# Patient Record
Sex: Female | Born: 2000 | Race: Black or African American | Hispanic: No | Marital: Single | Smoking: Never smoker
Health system: Southern US, Community
[De-identification: ages and names within clinical notes are randomized; demographics above are authoritative.]

## PROBLEM LIST (undated history)

## (undated) DIAGNOSIS — Z789 Other specified health status: Secondary | ICD-10-CM

## (undated) HISTORY — PX: NO PAST SURGERIES: SHX2092

## (undated) HISTORY — PX: WISDOM TOOTH EXTRACTION: SHX21

## (undated) HISTORY — DX: Other specified health status: Z78.9

---
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
## 2019-03-31 ENCOUNTER — Emergency Department
Admission: EM | Admit: 2019-03-31 | Discharge: 2019-03-31 | Disposition: A | Discharge status: Home / Self Care | Payer: BC Managed Care – PPO | Attending: Emergency Medicine | Admitting: Emergency Medicine

## 2019-03-31 ENCOUNTER — Other Ambulatory Visit: Payer: Self-pay

## 2019-03-31 ENCOUNTER — Encounter: Payer: Self-pay | Admitting: Emergency Medicine

## 2019-03-31 DIAGNOSIS — M7918 Myalgia, other site: Secondary | ICD-10-CM | POA: Diagnosis not present

## 2019-03-31 DIAGNOSIS — Z5321 Procedure and treatment not carried out due to patient leaving prior to being seen by health care provider: Secondary | ICD-10-CM | POA: Insufficient documentation

## 2019-03-31 DIAGNOSIS — R519 Headache, unspecified: Secondary | ICD-10-CM | POA: Diagnosis not present

## 2019-03-31 MED ORDER — ACETAMINOPHEN 500 MG PO TABS
1000.0000 mg | ORAL_TABLET | Freq: Once | ORAL | Status: AC
  Administered 2019-03-31: 1000 mg via ORAL
  Filled 2019-03-31: qty 2

## 2019-03-31 NOTE — ED Notes (Signed)
No answer when called several times from lobby 

## 2019-03-31 NOTE — ED Notes (Signed)
Tylenol admin at pt request

## 2019-03-31 NOTE — ED Triage Notes (Addendum)
Pt to triage via w/c with no distress noted, mask in place; EMS brought pt in from work; Pt c/o generalized HA since yesterday with body aches; no meds taken PTA for relief

## 2019-09-03 ENCOUNTER — Other Ambulatory Visit: Payer: Self-pay

## 2019-09-03 ENCOUNTER — Ambulatory Visit (LOCAL_COMMUNITY_HEALTH_CENTER): Payer: BC Managed Care – PPO

## 2019-09-03 VITALS — BP 110/75 | Ht 70.0 in | Wt 194.5 lb

## 2019-09-03 DIAGNOSIS — Z3201 Encounter for pregnancy test, result positive: Secondary | ICD-10-CM

## 2019-09-03 LAB — PREGNANCY, URINE: Preg Test, Ur: POSITIVE — AB

## 2019-09-03 NOTE — Progress Notes (Signed)
Pt. In Nurse Clinic for preg. Test. UPT positive today. VS taken and pt. Informed of positive result. When nurse returned to room, pt. Was not there. RN searched waiting area, bathroom areas. Phone call placed to number on file. No answer and unable to leave message as voice mailbox is not set up. Nurse Clinic pos. Preg. Test result visit not completed due to pt. Leaving. Jerel Shepherd, RN

## 2019-09-18 ENCOUNTER — Encounter: Payer: Self-pay | Admitting: Advanced Practice Midwife

## 2019-09-18 ENCOUNTER — Other Ambulatory Visit: Payer: Self-pay

## 2019-09-18 VITALS — BP 120/80 | Wt 201.0 lb

## 2019-09-18 LAB — POCT URINALYSIS DIPSTICK OB
Glucose, UA: NEGATIVE
POC,PROTEIN,UA: NEGATIVE

## 2019-09-21 NOTE — Progress Notes (Signed)
This encounter was created in error - please disregard.

## 2019-09-29 ENCOUNTER — Ambulatory Visit (INDEPENDENT_AMBULATORY_CARE_PROVIDER_SITE_OTHER): Payer: BC Managed Care – PPO | Admitting: Obstetrics and Gynecology

## 2019-09-29 ENCOUNTER — Encounter: Payer: Self-pay | Admitting: Obstetrics and Gynecology

## 2019-09-29 ENCOUNTER — Other Ambulatory Visit: Payer: Self-pay

## 2019-09-29 ENCOUNTER — Other Ambulatory Visit (HOSPITAL_COMMUNITY)
Admission: RE | Admit: 2019-09-29 | Discharge: 2019-09-29 | Disposition: A | Discharge status: Home / Self Care | Payer: BC Managed Care – PPO | Source: Ambulatory Visit | Attending: Obstetrics and Gynecology | Admitting: Obstetrics and Gynecology

## 2019-09-29 VITALS — BP 116/80 | Wt 192.0 lb

## 2019-09-29 DIAGNOSIS — Z3689 Encounter for other specified antenatal screening: Secondary | ICD-10-CM

## 2019-09-29 DIAGNOSIS — Z113 Encounter for screening for infections with a predominantly sexual mode of transmission: Secondary | ICD-10-CM | POA: Diagnosis present

## 2019-09-29 DIAGNOSIS — Z3401 Encounter for supervision of normal first pregnancy, first trimester: Secondary | ICD-10-CM

## 2019-09-29 DIAGNOSIS — Z3400 Encounter for supervision of normal first pregnancy, unspecified trimester: Secondary | ICD-10-CM

## 2019-09-29 DIAGNOSIS — Z3A10 10 weeks gestation of pregnancy: Secondary | ICD-10-CM

## 2019-09-29 DIAGNOSIS — Z31438 Encounter for other genetic testing of female for procreative management: Secondary | ICD-10-CM

## 2019-09-29 LAB — POCT URINALYSIS DIPSTICK OB
Glucose, UA: NEGATIVE
POC,PROTEIN,UA: NEGATIVE

## 2019-09-29 NOTE — Progress Notes (Signed)
New Obstetric Patient H&P    Chief Complaint: "Desires prenatal care"   History of Present Illness: Patient is a 19 y.o. G1P0 Not Hispanic or Latino female, presents with amenorrhea and positive home pregnancy test. Patient's last menstrual period was 07/18/2019 (exact date). and based on her  LMP, her EDD is Estimated Date of Delivery: 04/23/20 and her EGA is [redacted]w[redacted]d. Cycles are regular..    Since her LMP she claims she has experienced some morning sickness, fatigue, breast tenderness.  She denies vaginal bleeding. Her past medical history is noncontributory.   Since her LMP, she admits to the use of tobacco products  no There are cats in the home in the home  no  She admits close contact with children on a regular basis  yes  She has had chicken pox in the past no She has had Tuberculosis exposures, symptoms, or previously tested positive for TB   no Current or past history of domestic violence. no  Genetic Screening/Teratology Counseling: (Includes patient, baby's father, or anyone in either family with:)   1. Patient's age >/= 65 at [facility]  no 2. Thalassemia ([place], [place], Mediterranean, or Asian background): MCV<80  no 3. Neural tube defect (meningomyelocele, spina bifida, anencephaly)  no 4. Congenital heart defect  no  5. Down syndrome  no 6. Tay-Sachs (Jewish, [place]  no 7. Canavan's Disease  no 8. Sickle cell disease or trait (African)  no  9. Hemophilia or other blood disorders  no  10. Muscular dystrophy  no  11. Cystic fibrosis  no  12. Huntington's Chorea  no  13. Mental retardation/autism  no 14. Other inherited genetic or chromosomal disorder  no 15. Maternal metabolic disorder (DM, PKU, etc)  no 16. Patient or FOB with a child with a birth defect not listed above no  16a. Patient or FOB with a birth defect themselves no 17. Recurrent pregnancy loss, or stillbirth  no  18. Any medications since LMP other than prenatal vitamins (include vitamins,  supplements, OTC meds, drugs, alcohol)  no 19. Any other genetic/environmental exposure to discuss  no  Infection History:   1. Lives with someone with TB or TB exposed  no  2. Patient or partner has history of genital herpes  no 3. Rash or viral illness since LMP  no 4. History of STI (GC, CT, HPV, syphilis, HIV)  no 5. History of recent travel :  no  Other pertinent information:  no     Review of Systems:10 point review of systems negative unless otherwise noted in HPI  Past Medical History:  There are no problems to display for this patient.   Past Surgical History:  Past Surgical History:  Procedure Laterality Date  . NO PAST SURGERIES      Gynecologic History: Patient's last menstrual period was 07/18/2019 (exact date).  Obstetric History: G1P0  Family History:  History reviewed. No pertinent family history.  Social History:  Social History   Socioeconomic History  . Marital status: Single    Spouse name: Not on file  . Number of children: Not on file  . Years of education: Not on file  . Highest education level: Not on file  Occupational History  . Not on file  Tobacco Use  . Smoking status: Never Smoker  . Smokeless tobacco: Never Used  Vaping Use  . Vaping Use: Never used  Substance and Sexual Activity  . Alcohol use: No  . Drug use: Not Currently  . Sexual activity:  Yes    Birth control/protection: None  Other Topics Concern  . Not on file  Social History Narrative  . Not on file   Social Determinants of Health   Financial Resource Strain:   . Difficulty of Paying Living Expenses:   Food Insecurity:   . Worried About Programme researcher, broadcasting/film/video in the Last Year:   . Barista in the Last Year:   Transportation Needs:   . Freight forwarder (Medical):   Marland Kitchen Lack of Transportation (Non-Medical):   Physical Activity:   . Days of Exercise per Week:   . Minutes of Exercise per Session:   Stress:   . Feeling of Stress :   Social  Connections:   . Frequency of Communication with Friends and Family:   . Frequency of Social Gatherings with Friends and Family:   . Attends Religious Services:   . Active Member of Clubs or Organizations:   . Attends Banker Meetings:   Marland Kitchen Marital Status:   Intimate Partner Violence:   . Fear of Current or Ex-Partner:   . Emotionally Abused:   Marland Kitchen Physically Abused:   . Sexually Abused:     Allergies:  Allergies  Allergen Reactions  . Penicillins     Medications: Prior to Admission medications   Not on File    Physical Exam Vitals: Blood pressure 116/80, weight 192 lb (87.1 kg), last menstrual period 07/18/2019. Body mass index is 27.55 kg/m.  General: NAD HEENT: normocephalic, anicteric Thyroid: no enlargement, no palpable nodules Pulmonary: No increased work of breathing, CTAB Cardiovascular: RRR, distal pulses 2+ Abdomen: NABS, soft, non-tender, non-distended.  Umbilicus without lesions.  No hepatomegaly, splenomegaly or masses palpable. No evidence of hernia  Genitourinary:  External: Normal external female genitalia.  Normal urethral meatus, normal  Bartholin's and Skene's glands.    Vagina: Normal vaginal mucosa, no evidence of prolapse.    Cervix: Grossly normal in appearance, no bleeding  Uterus:  Non-enlarged, mobile, normal contour.  No CMT  Adnexa: ovaries non-enlarged, no adnexal masses  Rectal: deferred Extremities: no edema, erythema, or tenderness Neurologic: Grossly intact Psychiatric: mood appropriate, affect full   Assessment: 19 y.o. G1P0 at [redacted]w[redacted]d presenting to initiate prenatal care  Plan: 1) Avoid alcoholic beverages. 2) Patient encouraged not to smoke.  3) Discontinue the use of all non-medicinal drugs and chemicals.  4) Take prenatal vitamins daily.  5) Nutrition, food safety (fish, cheese advisories, and high nitrite foods) and exercise discussed. 6) Hospital and practice style discussed with cross coverage system.  7)  Genetic Screening, such as with 1st Trimester Screening, cell free fetal DNA, AFP testing, and Ultrasound, as well as with amniocentesis and CVS as appropriate, is discussed with patient. At the conclusion of today's visit patient requested genetic testing 8) Dating scan ordered   Vena [place], MD, [facility], [facility] 09/29/2019, 9:00 AM

## 2019-09-29 NOTE — Progress Notes (Signed)
ROB Vomiting

## 2019-09-30 LAB — CERVICOVAGINAL ANCILLARY ONLY
Chlamydia: NEGATIVE
Comment: NEGATIVE
Comment: NORMAL
Neisseria Gonorrhea: NEGATIVE

## 2019-09-30 LAB — PROTEIN / CREATININE RATIO, URINE
Creatinine, Urine: 432.8 mg/dL
Protein, Ur: 23.5 mg/dL
Protein/Creat Ratio: 54 mg/g creat (ref 0–200)

## 2019-10-01 LAB — RPR+RH+ABO+RUB AB+AB SCR+CB...
Antibody Screen: NEGATIVE
HIV Screen 4th Generation wRfx: NONREACTIVE
Hematocrit: 39.4 % (ref 34.0–46.6)
Hemoglobin: 13.3 g/dL (ref 11.1–15.9)
Hepatitis B Surface Ag: NEGATIVE
MCH: 29.3 pg (ref 26.6–33.0)
MCHC: 33.8 g/dL (ref 31.5–35.7)
MCV: 87 fL (ref 79–97)
Platelets: 256 10*3/uL (ref 150–450)
RBC: 4.54 x10E6/uL (ref 3.77–5.28)
RDW: 12.2 % (ref 11.7–15.4)
RPR Ser Ql: NONREACTIVE
Rh Factor: POSITIVE
Rubella Antibodies, IGG: 4.77 index (ref >0.99)
Varicella zoster IgG: 3490 index (ref >165)
WBC: 4.5 10*3/uL (ref 3.4–10.8)

## 2019-10-01 LAB — HGB FRACTIONATION CASCADE
Hgb A2: 3 % (ref 1.8–3.2)
Hgb A: 96.5 % (ref 96.4–98.8)
Hgb F: 0.5 % (ref 0.0–2.0)
Hgb S: 0 %

## 2019-10-01 LAB — URINE CULTURE

## 2019-10-08 ENCOUNTER — Ambulatory Visit (INDEPENDENT_AMBULATORY_CARE_PROVIDER_SITE_OTHER): Payer: BC Managed Care – PPO

## 2019-10-08 ENCOUNTER — Other Ambulatory Visit: Payer: Self-pay

## 2019-10-08 ENCOUNTER — Ambulatory Visit (INDEPENDENT_AMBULATORY_CARE_PROVIDER_SITE_OTHER): Payer: BC Managed Care – PPO | Admitting: Obstetrics

## 2019-10-08 VITALS — BP 118/74 | Wt 194.0 lb

## 2019-10-08 DIAGNOSIS — Z3400 Encounter for supervision of normal first pregnancy, unspecified trimester: Secondary | ICD-10-CM

## 2019-10-08 DIAGNOSIS — Z3A10 10 weeks gestation of pregnancy: Secondary | ICD-10-CM

## 2019-10-08 DIAGNOSIS — Z3689 Encounter for other specified antenatal screening: Secondary | ICD-10-CM

## 2019-10-08 NOTE — Progress Notes (Signed)
  Routine Prenatal Care Visit  Subjective  Gwendolyn Garrett is a 19 y.o. G1P0 at [redacted]w[redacted]d being seen today for ongoing prenatal care.  She is currently monitored for the following issues for this low-risk pregnancy and has Supervision of normal first pregnancy, antepartum on their problem list.  ----------------------------------------------------------------------------------- Patient reports no complaints.    . Vag. Bleeding: None.   . Leaking Fluid denies.  ----------------------------------------------------------------------------------- The following portions of the patient's history were reviewed and updated as appropriate: allergies, current medications, past family history, past medical history, past social history, past surgical history and problem list. Problem list updated.  Objective  Blood pressure 118/74, weight 194 lb (88 kg), last menstrual period 07/18/2019. Pregravid weight 185 lb (83.9 kg) Total Weight Gain 9 lb (4.082 kg) Urinalysis: Urine Protein    Urine Glucose    Fetal Status:           General:  Alert, oriented and cooperative. Patient is in no acute distress.  Skin: Skin is warm and dry. No rash noted.   Cardiovascular: Normal heart rate noted  Respiratory: Normal respiratory effort, no problems with respiration noted  Abdomen: Soft, gravid, appropriate for gestational age. Pain/Pressure: Absent     Pelvic:  Cervical exam deferred        Extremities: Normal range of motion.     Mental Status: Normal mood and affect. Normal behavior. Normal judgment and thought content.   Assessment   19 y.o. G1P0 at [redacted]w[redacted]d by  04/23/2020, by Last Menstrual Period presenting for routine prenatal visit  Plan   [email or web address]  Problems (from 07/18/19 to present)    No problems associated with this episode.       Preterm labor symptoms and general obstetric precautions including but not limited to vaginal bleeding, contractions, leaking of fluid and fetal movement were reviewed in  detail with the patient. Please refer to After Visit Summary for other counseling recommendations.  Discussed benefits of breastfeeding, and encouraged her to learn about and consider taking a class. EDD adjusted. Discussed fetasl testing. She would like the INheritest and MaternT  Return in about 4 weeks (around 11/05/2019) for return OB.  Mirna Mires, CNM  10/08/2019 4:42 PM

## 2019-10-08 NOTE — Progress Notes (Signed)
U/s today. No vb. No lof.  

## 2019-10-10 LAB — INHERITEST CORE(CF97,SMA,FRAX)

## 2019-11-02 ENCOUNTER — Other Ambulatory Visit: Payer: Self-pay

## 2019-11-02 ENCOUNTER — Ambulatory Visit (INDEPENDENT_AMBULATORY_CARE_PROVIDER_SITE_OTHER): Payer: BC Managed Care – PPO | Admitting: Obstetrics

## 2019-11-02 VITALS — BP 120/80 | Wt 190.0 lb

## 2019-11-02 DIAGNOSIS — Z3400 Encounter for supervision of normal first pregnancy, unspecified trimester: Secondary | ICD-10-CM

## 2019-11-02 DIAGNOSIS — Z3A13 13 weeks gestation of pregnancy: Secondary | ICD-10-CM

## 2019-11-02 LAB — POCT URINALYSIS DIPSTICK OB
Glucose, UA: NEGATIVE
POC,PROTEIN,UA: NEGATIVE

## 2019-11-02 MED ORDER — PNV-DHA 27-0.6-0.4-300 MG PO CAPS: 1.0000 | ORAL_CAPSULE | Freq: Once | ORAL | 11 refills | Status: AC

## 2019-11-02 NOTE — Addendum Note (Signed)
Addended by: Cornelius Moras D on: 11/02/2019 08:46 AM   Modules accepted: Orders

## 2019-11-02 NOTE — Progress Notes (Signed)
  Routine Prenatal Care Visit  Subjective  Gwendolyn Garrett is a 19 y.o. G1P0 at [redacted]w[redacted]d being seen today for ongoing prenatal care.  She is currently monitored for the following issues for this low-risk pregnancy and has Supervision of normal first pregnancy, antepartum on their problem list.  ----------------------------------------------------------------------------------- Patient reports no bleeding, no contractions and no cramping.  She is having a MaternT test draw today.  . Vag. Bleeding: None.   . Leaking Fluid denies.  ----------------------------------------------------------------------------------- The following portions of the patient's history were reviewed and updated as appropriate: allergies, current medications, past family history, past medical history, past social history, past surgical history and problem list. Problem list updated.  Objective  Blood pressure 120/80, weight 190 lb (86.2 kg), last menstrual period 07/18/2019. Pregravid weight 185 lb (83.9 kg) Total Weight Gain 5 lb (2.268 kg) Urinalysis: Urine Protein    Urine Glucose    Fetal Status:           General:  Alert, oriented and cooperative. Patient is in no acute distress.  Skin: Skin is warm and dry. No rash noted.   Cardiovascular: Normal heart rate noted  Respiratory: Normal respiratory effort, no problems with respiration noted  Abdomen: Soft, gravid, appropriate for gestational age. Pain/Pressure: Absent     Pelvic:  Cervical exam deferred        Extremities: Normal range of motion.     Mental Status: Normal mood and affect. Normal behavior. Normal judgment and thought content.   Assessment   19 y.o. G1P0 at [redacted]w[redacted]d by  05/03/2020, by Ultrasound presenting for routine prenatal visit  Plan   [email or web address]  Problems (from 07/18/19 to present)    No problems associated with this episode.       Preterm labor symptoms and general obstetric precautions including but not limited to vaginal bleeding,  contractions, leaking of fluid and fetal movement were reviewed in detail with the patient. Please refer to After Visit Summary for other counseling recommendations.  Prescription for Prenate Pixies sent to pharmacy.  Return in about 4 weeks (around 11/30/2019) for return OB.  Mirna Mires, CNM  11/02/2019 8:29 AM

## 2019-11-02 NOTE — Patient Instructions (Signed)
Breastfeeding ° °Choosing to breastfeed is one of the best decisions you can make for yourself and your baby. A change in hormones during pregnancy causes your breasts to make breast milk in your milk-producing glands. Hormones prevent breast milk from being released before your baby is born. They also prompt milk flow after birth. Once breastfeeding has begun, thoughts of your baby, as well as his or her sucking or crying, can stimulate the release of milk from your milk-producing glands. °Benefits of breastfeeding °Research shows that breastfeeding offers many health benefits for infants and mothers. It also offers a cost-free and convenient way to feed your baby. °For your baby °· Your first milk (colostrum) helps your baby's digestive system to function better. °· Special cells in your milk (antibodies) help your baby to fight off infections. °· Breastfed babies are less likely to develop asthma, allergies, obesity, or type 2 diabetes. They are also at lower risk for sudden infant death syndrome (SIDS). °· Nutrients in breast milk are better able to meet your baby’s needs compared to infant formula. °· Breast milk improves your baby's brain development. °For you °· Breastfeeding helps to create a very special bond between you and your baby. °· Breastfeeding is convenient. Breast milk costs nothing and is always available at the correct temperature. °· Breastfeeding helps to burn calories. It helps you to lose the weight that you gained during pregnancy. °· Breastfeeding makes your uterus return faster to its size before pregnancy. It also slows bleeding (lochia) after you give birth. °· Breastfeeding helps to lower your risk of developing type 2 diabetes, osteoporosis, rheumatoid arthritis, cardiovascular disease, and breast, ovarian, uterine, and endometrial cancer later in life. °Breastfeeding basics °Starting breastfeeding °· Find a comfortable place to sit or lie down, with your neck and back  well-supported. °· Place a pillow or a rolled-up blanket under your baby to bring him or her to the level of your breast (if you are seated). Nursing pillows are specially designed to help support your arms and your baby while you breastfeed. °· Make sure that your baby's tummy (abdomen) is facing your abdomen. °· Gently massage your breast. With your fingertips, massage from the outer edges of your breast inward toward the nipple. This encourages milk flow. If your milk flows slowly, you may need to continue this action during the feeding. °· Support your breast with 4 fingers underneath and your thumb above your nipple (make the letter "C" with your hand). Make sure your fingers are well away from your nipple and your baby’s mouth. °· Stroke your baby's lips gently with your finger or nipple. °· When your baby's mouth is open wide enough, quickly bring your baby to your breast, placing your entire nipple and as much of the areola as possible into your baby's mouth. The areola is the colored area around your nipple. °? More areola should be visible above your baby's upper lip than below the lower lip. °? Your baby's lips should be opened and extended outward (flanged) to ensure an adequate, comfortable latch. °? Your baby's tongue should be between his or her lower gum and your breast. °· Make sure that your baby's mouth is correctly positioned around your nipple (latched). Your baby's lips should create a seal on your breast and be turned out (everted). °· It is common for your baby to suck about 2-3 minutes in order to start the flow of breast milk. °Latching °Teaching your baby how to latch onto your breast properly is   very important. An improper latch can cause nipple pain, decreased milk supply, and poor weight gain in your baby. Also, if your baby is not latched onto your nipple properly, he or she may swallow some air during feeding. This can make your baby fussy. Burping your baby when you switch breasts  during the feeding can help to get rid of the air. However, teaching your baby to latch on properly is still the best way to prevent fussiness from swallowing air while breastfeeding. °Signs that your baby has successfully latched onto your nipple °· Silent tugging or silent sucking, without causing you pain. Infant's lips should be extended outward (flanged). °· Swallowing heard between every 3-4 sucks once your milk has started to flow (after your let-down milk reflex occurs). °· Muscle movement above and in front of his or her ears while sucking. °Signs that your baby has not successfully latched onto your nipple °· Sucking sounds or smacking sounds from your baby while breastfeeding. °· Nipple pain. °If you think your baby has not latched on correctly, slip your finger into the corner of your baby’s mouth to break the suction and place it between your baby's gums. Attempt to start breastfeeding again. °Signs of successful breastfeeding °Signs from your baby °· Your baby will gradually decrease the number of sucks or will completely stop sucking. °· Your baby will fall asleep. °· Your baby's body will relax. °· Your baby will retain a small amount of milk in his or her mouth. °· Your baby will let go of your breast by himself or herself. °Signs from you °· Breasts that have increased in firmness, weight, and size 1-3 hours after feeding. °· Breasts that are softer immediately after breastfeeding. °· Increased milk volume, as well as a change in milk consistency and color by the fifth day of breastfeeding. °· Nipples that are not sore, cracked, or bleeding. °Signs that your baby is getting enough milk °· Wetting at least 1-2 diapers during the first 24 hours after birth. °· Wetting at least 5-6 diapers every 24 hours for the first week after birth. The urine should be clear or pale yellow by the age of 5 days. °· Wetting 6-8 diapers every 24 hours as your baby continues to grow and develop. °· At least 3 stools in  a 24-hour period by the age of 5 days. The stool should be soft and yellow. °· At least 3 stools in a 24-hour period by the age of 7 days. The stool should be seedy and yellow. °· No loss of weight greater than 10% of birth weight during the first 3 days of life. °· Average weight gain of 4-7 oz (113-198 g) per week after the age of 4 days. °· Consistent daily weight gain by the age of 5 days, without weight loss after the age of 2 weeks. °After a feeding, your baby may spit up a small amount of milk. This is normal. °Breastfeeding frequency and duration °Frequent feeding will help you make more milk and can prevent sore nipples and extremely full breasts (breast engorgement). Breastfeed when you feel the need to reduce the fullness of your breasts or when your baby shows signs of hunger. This is called "breastfeeding on demand." Signs that your baby is hungry include: °· Increased alertness, activity, or restlessness. °· Movement of the head from side to side. °· Opening of the mouth when the corner of the mouth or cheek is stroked (rooting). °· Increased sucking sounds, smacking lips, cooing,   sighing, or squeaking.  Hand-to-mouth movements and sucking on fingers or hands.  Fussing or crying. Avoid introducing a pacifier to your baby in the first 4-6 weeks after your baby is born. After this time, you may choose to use a pacifier. Research has shown that pacifier use during the first year of a baby's life decreases the risk of sudden infant death syndrome (SIDS). Allow your baby to feed on each breast as long as he or she wants. When your baby unlatches or falls asleep while feeding from the first breast, offer the second breast. Because newborns are often sleepy in the first few weeks of life, you may need to awaken your baby to get him or her to feed. Breastfeeding times will vary from baby to baby. However, the following rules can serve as a guide to help you make sure that your baby is properly  fed:  Newborns (babies 4 weeks of age or younger) may breastfeed every 1-3 hours.  Newborns should not go without breastfeeding for longer than 3 hours during the day or 5 hours during the night.  You should breastfeed your baby a minimum of 8 times in a 24-hour period. Breast milk pumping     Pumping and storing breast milk allows you to make sure that your baby is exclusively fed your breast milk, even at times when you are unable to breastfeed. This is especially important if you go back to work while you are still breastfeeding, or if you are not able to be present during feedings. Your lactation consultant can help you find a method of pumping that works best for you and give you guidelines about how long it is safe to store breast milk. Caring for your breasts while you breastfeed Nipples can become dry, cracked, and sore while breastfeeding. The following recommendations can help keep your breasts moisturized and healthy:  Avoid using soap on your nipples.  Wear a supportive bra designed especially for nursing. Avoid wearing underwire-style bras or extremely tight bras (sports bras).  Air-dry your nipples for 3-4 minutes after each feeding.  Use only cotton bra pads to absorb leaked breast milk. Leaking of breast milk between feedings is normal.  Use lanolin on your nipples after breastfeeding. Lanolin helps to maintain your skin's normal moisture barrier. Pure lanolin is not harmful (not toxic) to your baby. You may also hand express a few drops of breast milk and gently massage that milk into your nipples and allow the milk to air-dry. In the first few weeks after giving birth, some women experience breast engorgement. Engorgement can make your breasts feel heavy, warm, and tender to the touch. Engorgement peaks within 3-5 days after you give birth. The following recommendations can help to ease engorgement:  Completely empty your breasts while breastfeeding or pumping. You may  want to start by applying warm, moist heat (in the shower or with warm, water-soaked hand towels) just before feeding or pumping. This increases circulation and helps the milk flow. If your baby does not completely empty your breasts while breastfeeding, pump any extra milk after he or she is finished.  Apply ice packs to your breasts immediately after breastfeeding or pumping, unless this is too uncomfortable for you. To do this: ? Put ice in a plastic bag. ? Place a towel between your skin and the bag. ? Leave the ice on for 20 minutes, 2-3 times a day.  Make sure that your baby is latched on and positioned properly while breastfeeding. If   engorgement persists after 48 hours of following these recommendations, contact your health care provider or a [employer]. Overall health care recommendations while breastfeeding  Eat 3 healthy meals and 3 snacks every day. Well-nourished mothers who are breastfeeding need an additional 450-500 calories a day. You can meet this requirement by increasing the amount of a balanced diet that you eat.  Drink enough water to keep your urine pale yellow or clear.  Rest often, relax, and continue to take your prenatal vitamins to prevent fatigue, stress, and low vitamin and mineral levels in your body (nutrient deficiencies).  Do not use any products that contain nicotine or tobacco, such as cigarettes and e-cigarettes. Your baby may be harmed by chemicals from cigarettes that pass into breast milk and exposure to secondhand smoke. If you need help quitting, ask your health care provider.  Avoid alcohol.  Do not use illegal drugs or marijuana.  Talk with your health care provider before taking any medicines. These include over-the-counter and prescription medicines as well as vitamins and herbal supplements. Some medicines that may be harmful to your baby can pass through breast milk.  It is possible to become pregnant while breastfeeding. If birth  control is desired, ask your health care provider about options that will be safe while breastfeeding your baby. Where to find more information: [employer]: www.llli.org Contact a health care provider if:  You feel like you want to stop breastfeeding or have become frustrated with breastfeeding.  Your nipples are cracked or bleeding.  Your breasts are red, tender, or warm.  You have: ? Painful breasts or nipples. ? A swollen area on either breast. ? A fever or chills. ? Nausea or vomiting. ? Drainage other than breast milk from your nipples.  Your breasts do not become full before feedings by the fifth day after you give birth.  You feel sad and depressed.  Your baby is: ? Too sleepy to eat well. ? Having trouble sleeping. ? More than 74 week old and wetting fewer than 6 diapers in a 24-hour period. ? Not gaining weight by 35 days of age.  Your baby has fewer than 3 stools in a 24-hour period.  Your baby's skin or the white parts of his or her eyes become yellow. Get help right away if:  Your baby is overly tired (lethargic) and does not want to wake up and feed.  Your baby develops an unexplained fever. Summary  Breastfeeding offers many health benefits for infant and mothers.  Try to breastfeed your infant when he or she shows early signs of hunger.  Gently tickle or stroke your baby's lips with your finger or nipple to allow the baby to open his or her mouth. Bring the baby to your breast. Make sure that much of the areola is in your baby's mouth. Offer one side and burp the baby before you offer the other side.  Talk with your health care provider or lactation consultant if you have questions or you face problems as you breastfeed. This information is not intended to replace advice given to you by your health care provider. Make sure you discuss any questions you have with your health care provider. Document Revised: 06/13/2017 Document Reviewed:  04/20/2016 Elsevier Patient Education  2020 [employer]. Second Trimester of Pregnancy  The second trimester is from week 14 through week 27 (month 4 through 6). This is often the time in pregnancy that you feel your best. Often times, morning sickness has  lessened or quit. You may have more energy, and you may get hungry more often. Your unborn baby is growing rapidly. At the end of the sixth month, he or she is about 9 inches long and weighs about 1 pounds. You will likely feel the baby move between 18 and 20 weeks of pregnancy. Follow these instructions at home: Medicines  Take over-the-counter and prescription medicines only as told by your doctor. Some medicines are safe and some medicines are not safe during pregnancy.  Take a prenatal vitamin that contains at least 600 micrograms (mcg) of folic acid.  If you have trouble pooping (constipation), take medicine that will make your stool soft (stool softener) if your doctor approves. Eating and drinking   Eat regular, healthy meals.  Avoid raw meat and uncooked cheese.  If you get low calcium from the food you eat, talk to your doctor about taking a daily calcium supplement.  Avoid foods that are high in fat and sugars, such as fried and sweet foods.  If you feel sick to your stomach (nauseous) or throw up (vomit): ? Eat 4 or 5 small meals a day instead of 3 large meals. ? Try eating a few soda crackers. ? Drink liquids between meals instead of during meals.  To prevent constipation: ? Eat foods that are high in fiber, like fresh fruits and vegetables, whole grains, and beans. ? Drink enough fluids to keep your pee (urine) clear or pale yellow. Activity  Exercise only as told by your doctor. Stop exercising if you start to have cramps.  Do not exercise if it is too hot, too humid, or if you are in a place of great height (high altitude).  Avoid heavy lifting.  Wear low-heeled shoes. Sit and stand up straight.  You can  continue to have sex unless your doctor tells you not to. Relieving pain and discomfort  Wear a good support bra if your breasts are tender.  Take warm water baths (sitz baths) to soothe pain or discomfort caused by hemorrhoids. Use hemorrhoid cream if your doctor approves.  Rest with your legs raised if you have leg cramps or low back pain.  If you develop puffy, bulging veins (varicose veins) in your legs: ? Wear support hose or compression stockings as told by your doctor. ? Raise (elevate) your feet for 15 minutes, 3-4 times a day. ? Limit salt in your food. Prenatal care  Write down your questions. Take them to your prenatal visits.  Keep all your prenatal visits as told by your doctor. This is important. Safety  Wear your seat belt when driving.  Make a list of emergency phone numbers, including numbers for family, friends, the hospital, and police and fire departments. General instructions  Ask your doctor about the right foods to eat or for help finding a counselor, if you need these services.  Ask your doctor about local prenatal classes. Begin classes before month 6 of your pregnancy.  Do not use hot tubs, steam rooms, or saunas.  Do not douche or use tampons or scented sanitary pads.  Do not cross your legs for long periods of time.  Visit your dentist if you have not done so. Use a soft toothbrush to brush your teeth. Floss gently.  Avoid all smoking, herbs, and alcohol. Avoid drugs that are not approved by your doctor.  Do not use any products that contain nicotine or tobacco, such as cigarettes and e-cigarettes. If you need help quitting, ask your doctor.  Avoid cat litter boxes and soil used by cats. These carry germs that can cause birth defects in the baby and can cause a loss of your baby (miscarriage) or stillbirth. Contact a doctor if:  You have mild cramps or pressure in your lower belly.  You have pain when you pee (urinate).  You have bad  smelling fluid coming from your vagina.  You continue to feel sick to your stomach (nauseous), throw up (vomit), or have watery poop (diarrhea).  You have a nagging pain in your belly area.  You feel dizzy. Get help right away if:  You have a fever.  You are leaking fluid from your vagina.  You have spotting or bleeding from your vagina.  You have severe belly cramping or pain.  You lose or gain weight rapidly.  You have trouble catching your breath and have chest pain.  You notice sudden or extreme puffiness (swelling) of your face, hands, ankles, feet, or legs.  You have not felt the baby move in over an hour.  You have severe headaches that do not go away when you take medicine.  You have trouble seeing. Summary  The second trimester is from week 14 through week 27 (months 4 through 6). This is often the time in pregnancy that you feel your best.  To take care of yourself and your unborn baby, you will need to eat healthy meals, take medicines only if your doctor tells you to do so, and do activities that are safe for you and your baby.  Call your doctor if you get sick or if you notice anything unusual about your pregnancy. Also, call your doctor if you need help with the right food to eat, or if you want to know what activities are safe for you. This information is not intended to replace advice given to you by your health care provider. Make sure you discuss any questions you have with your health care provider. Document Revised: 07/11/2018 Document Reviewed: 04/24/2016 Elsevier Patient Education  2020 [employer].

## 2019-11-08 LAB — MATERNIT 21 PLUS CORE, BLOOD
Fetal Fraction: 9
Result (T21): NEGATIVE
Trisomy 13 (Patau syndrome): NEGATIVE
Trisomy 18 (Edwards syndrome): NEGATIVE
Trisomy 21 (Down syndrome): NEGATIVE

## 2019-12-08 ENCOUNTER — Other Ambulatory Visit: Payer: Self-pay

## 2019-12-08 ENCOUNTER — Encounter: Payer: BC Managed Care – PPO | Admitting: Advanced Practice Midwife

## 2019-12-08 ENCOUNTER — Other Ambulatory Visit: Payer: Self-pay | Admitting: Obstetrics and Gynecology

## 2019-12-08 DIAGNOSIS — Z3689 Encounter for other specified antenatal screening: Secondary | ICD-10-CM

## 2019-12-10 ENCOUNTER — Ambulatory Visit (INDEPENDENT_AMBULATORY_CARE_PROVIDER_SITE_OTHER): Payer: Medicaid Other | Admitting: Obstetrics & Gynecology

## 2019-12-10 ENCOUNTER — Encounter: Payer: Self-pay | Admitting: Obstetrics & Gynecology

## 2019-12-10 ENCOUNTER — Other Ambulatory Visit: Payer: Self-pay

## 2019-12-10 VITALS — BP 120/80 | Wt 191.0 lb

## 2019-12-10 DIAGNOSIS — Z3689 Encounter for other specified antenatal screening: Secondary | ICD-10-CM

## 2019-12-10 DIAGNOSIS — Z3402 Encounter for supervision of normal first pregnancy, second trimester: Secondary | ICD-10-CM

## 2019-12-10 DIAGNOSIS — Z3A19 19 weeks gestation of pregnancy: Secondary | ICD-10-CM

## 2019-12-10 NOTE — Patient Instructions (Signed)
Prenatal Ultrasound A prenatal ultrasound exam, also called a sonogram, is an imaging test that allows your health care provider to see your baby and placenta in the uterus. This is a safe and painless test that does not expose you or your baby to any X-rays, needles, or medicines. Prenatal ultrasounds are done using a handheld plastic device (transducer) that sends out sound waves (ultrasound). The sound waves reflect off your baby's bones and other tissues to create moving images on a computer screen. There are two types of prenatal ultrasound:  Transabdominal ultrasound. During this test, a transducer is placed on your belly and moved around. A routine transabdominal ultrasound is usually done between weeks 18 and 22 of pregnancy (standard ultrasound). It may also be done between weeks 13 and 14.  Transvaginal ultrasound. During this test, a transducer that is shaped like a wand is placed inside your vagina. This type of ultrasound is usually done during early pregnancy. Prenatal ultrasounds may be used to check:  How far along your pregnancy is (stage).  Your baby's development (gestational age).  The location and condition of the organ that supplies your baby with nourishment and oxygen (placenta).  Your baby's heart rate, position, and movements.  Your baby's approximate size and weight.  The amount of fluid surrounding your baby (amniotic fluid).  If you are carrying more than one baby.  Your baby's sex (if your baby is in a position that allows the sex organs to be seen, and if you choose to learn the sex at this time).  If there are any possible problems that require more testing, such as genetic problems.  If your pregnancy is forming outside your uterus (ectopic pregnancy). You may have other ultrasounds as needed at any point during your pregnancy. If your health care provider suspects a problem, you may also have a more detailed type of transabdominal ultrasound (advanced  ultrasound). What are the risks? Generally, this is a safe test. There are no known risks for you or your baby from a prenatal ultrasound. What happens before the test?  Before a transabdominal ultrasound, you may be asked to drink fluid 2 hours before the exam and avoid emptying your bladder. A full bladder helps the images show up more clearly.  Before a transvaginal ultrasound, you may be asked to empty your bladder before the exam.  Wear loose, comfortable clothing so it is easy to undress or expose your lower belly for the exam. What happens during the test? If you are having a transabdominal ultrasound:  You will lie on an exam table.  Your belly will be exposed.  Gel will be rubbed over your belly.  The transducer will be pressed on your belly and moved back and forth, through the gel. You may feel slight pressure, but there should not be any pain.  You may be asked to change your position.  You may hear sounds of blood flow and your baby's heartbeat. You may be able to see images of your baby on the computer screen. Your health care provider may measure your baby's head and other body parts, looking for normal development.  After the exam, the gel will be cleaned off, and you can replace your clothing. You will be able to empty your bladder after the exam is done. If you are having a transvaginal ultrasound:  You will change into a hospital gown or undress from the waist down and cover yourself with a paper sheet.  You will lie down on   an exam table with your feet in footrests (stirrups).  The transducer will be covered with a protective cover and lubricated.  The transducer will be inserted into your vagina.  You may hear sounds of blood flow and your baby's heartbeat. You may be able to see images of your baby on the computer screen.  After the exam, the transducer will be removed, and you can put your clothes back on. What can I expect after the test?  You can  drive yourself home and return to all your normal activities.  A health care provider trained in interpreting ultrasounds will review the images taken during your exam and send a report to your health care provider.  It is up to you to get your test results. Ask your health care provider, or the department that is doing the test, when your results will be ready. Questions to ask your health care provider  Why am I having this prenatal ultrasound?  What information will this exam provide?  How much does this exam cost? What costs will my insurance cover?  Can my partner or support person be with me during the exam?  When can I expect to get the results? Summary  A prenatal ultrasound is a safe and painless imaging exam that gives information about your pregnancy and your developing baby.  Transvaginal ultrasound exams are often done in early pregnancy. Standard transabdominal ultrasounds are typically done between 18 and 22 weeks of pregnancy. You may have other prenatal ultrasounds as needed.  This exam has no risks for you or your baby. After the exam, you can go home and return to all your usual activities. This information is not intended to replace advice given to you by your health care provider. Make sure you discuss any questions you have with your health care provider. Document Revised: 07/11/2018 Document Reviewed: 05/22/2017 Elsevier Patient Education  2020 [employer]  
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
## 2019-12-16 ENCOUNTER — Ambulatory Visit (INDEPENDENT_AMBULATORY_CARE_PROVIDER_SITE_OTHER): Payer: Medicaid Other

## 2019-12-16 ENCOUNTER — Other Ambulatory Visit: Payer: Self-pay

## 2019-12-16 ENCOUNTER — Ambulatory Visit (INDEPENDENT_AMBULATORY_CARE_PROVIDER_SITE_OTHER): Payer: Medicaid Other | Admitting: Obstetrics and Gynecology

## 2019-12-16 ENCOUNTER — Other Ambulatory Visit: Payer: Self-pay | Admitting: Obstetrics and Gynecology

## 2019-12-16 ENCOUNTER — Encounter: Payer: Self-pay | Admitting: Obstetrics and Gynecology

## 2019-12-16 VITALS — BP 100/72 | Wt 189.6 lb

## 2019-12-16 DIAGNOSIS — Z3402 Encounter for supervision of normal first pregnancy, second trimester: Secondary | ICD-10-CM

## 2019-12-16 DIAGNOSIS — Z3400 Encounter for supervision of normal first pregnancy, unspecified trimester: Secondary | ICD-10-CM

## 2019-12-16 DIAGNOSIS — Z3686 Encounter for antenatal screening for cervical length: Secondary | ICD-10-CM

## 2019-12-16 DIAGNOSIS — Z3689 Encounter for other specified antenatal screening: Secondary | ICD-10-CM

## 2019-12-16 DIAGNOSIS — Z3A20 20 weeks gestation of pregnancy: Secondary | ICD-10-CM

## 2019-12-16 NOTE — Patient Instructions (Signed)
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
## 2020-01-01 ENCOUNTER — Ambulatory Visit (INDEPENDENT_AMBULATORY_CARE_PROVIDER_SITE_OTHER): Payer: Medicaid Other | Admitting: Advanced Practice Midwife

## 2020-01-01 ENCOUNTER — Ambulatory Visit (INDEPENDENT_AMBULATORY_CARE_PROVIDER_SITE_OTHER): Payer: Medicaid Other

## 2020-01-01 ENCOUNTER — Other Ambulatory Visit: Payer: Self-pay

## 2020-01-01 ENCOUNTER — Encounter: Payer: Self-pay | Admitting: Advanced Practice Midwife

## 2020-01-01 VITALS — BP 100/60 | Wt 194.0 lb

## 2020-01-01 DIAGNOSIS — Z130 Encounter for screening for diseases of the blood and blood-forming organs and certain disorders involving the immune mechanism: Secondary | ICD-10-CM

## 2020-01-01 DIAGNOSIS — Z3402 Encounter for supervision of normal first pregnancy, second trimester: Secondary | ICD-10-CM

## 2020-01-01 DIAGNOSIS — Z113 Encounter for screening for infections with a predominantly sexual mode of transmission: Secondary | ICD-10-CM

## 2020-01-01 DIAGNOSIS — Z3A20 20 weeks gestation of pregnancy: Secondary | ICD-10-CM | POA: Diagnosis not present

## 2020-01-01 DIAGNOSIS — Z131 Encounter for screening for diabetes mellitus: Secondary | ICD-10-CM

## 2020-01-01 DIAGNOSIS — Z3A22 22 weeks gestation of pregnancy: Secondary | ICD-10-CM

## 2020-01-01 LAB — POCT URINALYSIS DIPSTICK OB
Glucose, UA: NEGATIVE
POC,PROTEIN,UA: NEGATIVE

## 2020-01-01 NOTE — Progress Notes (Signed)
ROB/Anatomy scan- no concerns/declines flu shot

## 2020-01-01 NOTE — Progress Notes (Addendum)
Routine Prenatal Care Visit  Subjective  Gwendolyn Garrett is a 19 y.o. G1P0 at [redacted]w[redacted]d being seen today for ongoing prenatal care.  She is currently monitored for the following issues for this low-risk pregnancy and has Supervision of normal first pregnancy, antepartum on their problem list.  ----------------------------------------------------------------------------------- Patient reports no complaints.  Condolences given and encouraged patient to rely on her support system Contractions: Not present. Vag. Bleeding: None.  Movement: Present. Leaking Fluid denies.  ----------------------------------------------------------------------------------- The following portions of the patient's history were reviewed and updated as appropriate: allergies, current medications, past family history, past medical history, past social history, past surgical history and problem list. Problem list updated.  Objective  Blood pressure 100/60, weight 194 lb (88 kg), last menstrual period 07/18/2019. Pregravid weight 185 lb (83.9 kg) Total Weight Gain 9 lb (4.082 kg) Urinalysis: Urine Protein Negative  Urine Glucose Negative  Fetal Status: Fetal Heart Rate (bpm): 155 Fundal Height: 22 cm Movement: Present  Presentation: Transverse   Follow up anatomy scan is complete today  General:  Alert, oriented and cooperative. Patient is in no acute distress.  Skin: Skin is warm and dry. No rash noted.   Cardiovascular: Normal heart rate noted  Respiratory: Normal respiratory effort, no problems with respiration noted  Abdomen: Soft, gravid, appropriate for gestational age. Pain/Pressure: Absent     Pelvic:  Cervical exam deferred        Extremities: Normal range of motion.  Edema: None  Mental Status: Normal mood and affect. Normal behavior. Normal judgment and thought content.   Assessment   19 y.o. G1P0 at [redacted]w[redacted]d by  05/03/2020, by Ultrasound presenting for routine prenatal visit  Plan   pregnancy1  Problems  (from 07/18/19 to present)    Problem Noted Resolved   Supervision of normal first pregnancy, antepartum 09/29/2019 by Vena Austria, MD No   Overview Addendum 12/16/2019  9:56 AM by Natale Milch, MD     Clinic  [facility] Prenatal Labs  Dating  Blood type: B/Positive/-- (06/29 0940)   Genetic Screen SMA neg, Fragile-X neg, CF neg  NIPS:MaternTnegative neg Antibody:Negative (06/29 0940)  Anatomic [place] Complete 01/01/20 Rubella: 4.77 (06/29 0940)  GTT Early: NA                Third trimester:  RPR: Non Reactive (06/29 0940)   Flu vaccine  HBsAg: Negative (06/29 0940)   TDaP vaccine                                               Rhogam: HIV: Non Reactive (06/29 0940)   Baby Food                                               GBS: (For PCN allergy, check sensitivities)  Contraception  Pap: Under 21  Circumcision    Pediatrician    Support Person              Previous Version       Preterm labor symptoms and general obstetric precautions including but not limited to vaginal bleeding, contractions, leaking of fluid and fetal movement were reviewed in detail with the patient.   Return in about 4 weeks (around 01/29/2020) for 28 wk labs and rob.  Tresea Mall, CNM 01/01/2020 10:22 AM

## 2020-01-26 ENCOUNTER — Encounter: Payer: Self-pay | Admitting: Advanced Practice Midwife

## 2020-01-26 ENCOUNTER — Other Ambulatory Visit: Payer: Medicaid Other

## 2020-01-26 ENCOUNTER — Other Ambulatory Visit: Payer: Self-pay

## 2020-01-26 ENCOUNTER — Ambulatory Visit (INDEPENDENT_AMBULATORY_CARE_PROVIDER_SITE_OTHER): Payer: Medicaid Other | Admitting: Advanced Practice Midwife

## 2020-01-26 VITALS — BP 110/60 | Wt 198.0 lb

## 2020-01-26 DIAGNOSIS — Z130 Encounter for screening for diseases of the blood and blood-forming organs and certain disorders involving the immune mechanism: Secondary | ICD-10-CM

## 2020-01-26 DIAGNOSIS — Z3A26 26 weeks gestation of pregnancy: Secondary | ICD-10-CM

## 2020-01-26 DIAGNOSIS — Z113 Encounter for screening for infections with a predominantly sexual mode of transmission: Secondary | ICD-10-CM

## 2020-01-26 DIAGNOSIS — Z3400 Encounter for supervision of normal first pregnancy, unspecified trimester: Secondary | ICD-10-CM

## 2020-01-26 DIAGNOSIS — Z3402 Encounter for supervision of normal first pregnancy, second trimester: Secondary | ICD-10-CM

## 2020-01-26 DIAGNOSIS — Z131 Encounter for screening for diabetes mellitus: Secondary | ICD-10-CM

## 2020-01-26 LAB — POCT URINALYSIS DIPSTICK OB
Glucose, UA: NEGATIVE
POC,PROTEIN,UA: NEGATIVE

## 2020-01-26 NOTE — Progress Notes (Signed)
  Routine Prenatal Care Visit  Subjective  Gwendolyn Garrett is a 19 y.o. G1P0 at [redacted]w[redacted]d being seen today for ongoing prenatal care.  She is currently monitored for the following issues for this low-risk pregnancy and has Supervision of normal first pregnancy, antepartum on their problem list.  ----------------------------------------------------------------------------------- Patient reports no complaints.   Contractions: Not present. Vag. Bleeding: None.  Movement: Present. Leaking Fluid denies.  ----------------------------------------------------------------------------------- The following portions of the patient's history were reviewed and updated as appropriate: allergies, current medications, past family history, past medical history, past social history, past surgical history and problem list. Problem list updated.  Objective  Blood pressure 110/60, weight 198 lb (89.8 kg), last menstrual period 07/18/2019. Pregravid weight 185 lb (83.9 kg) Total Weight Gain 13 lb (5.897 kg) Urinalysis: Urine Protein    Urine Glucose    Fetal Status: Fetal Heart Rate (bpm): 147 Fundal Height: 27 cm Movement: Present     General:  Alert, oriented and cooperative. Patient is in no acute distress.  Skin: Skin is warm and dry. No rash noted.   Cardiovascular: Normal heart rate noted  Respiratory: Normal respiratory effort, no problems with respiration noted  Abdomen: Soft, gravid, appropriate for gestational age. Pain/Pressure: Absent     Pelvic:  Cervical exam deferred        Extremities: Normal range of motion.  Edema: None  Mental Status: Normal mood and affect. Normal behavior. Normal judgment and thought content.   Assessment   19 y.o. G1P0 at [redacted]w[redacted]d by  05/03/2020, by Ultrasound presenting for routine prenatal visit  Plan   pregnancy1  Problems (from 07/18/19 to present)    Problem Noted Resolved   Supervision of normal first pregnancy, antepartum 09/29/2019 by Vena Austria, MD No    Overview Addendum 01/01/2020 10:37 AM by Tresea Mall, CNM     Clinic  [facility] Prenatal Labs  Dating  Blood type: B/Positive/-- (06/29 0940)   Genetic Screen SMA neg, Fragile-X neg, CF neg  NIPS:MaternTnegative neg Antibody:Negative (06/29 0940)  Anatomic [place] Complete Rubella: 4.77 (06/29 0940)  GTT Early: NA               Third trimester:  RPR: Non Reactive (06/29 0940)   Flu vaccine  HBsAg: Negative (06/29 0940)   TDaP vaccine                                               Rhogam: HIV: Non Reactive (06/29 0940)   Baby Food                                               GBS: (For PCN allergy, check sensitivities)  Contraception  Pap: Under 21  Circumcision    Pediatrician    Support Person              Previous Version    28 week labs today   Preterm labor symptoms and general obstetric precautions including but not limited to vaginal bleeding, contractions, leaking of fluid and fetal movement were reviewed in detail with the patient. Please refer to After Visit Summary for other counseling recommendations.   Return in about 2 weeks (around 02/09/2020) for rob.  Tresea Mall, CNM 01/26/2020 10:32 AM

## 2020-01-26 NOTE — Progress Notes (Signed)
No vb, no lof  

## 2020-01-27 LAB — 28 WEEK RH+PANEL
Basophils Absolute: 0 10*3/uL (ref 0.0–0.2)
Basos: 0 %
EOS (ABSOLUTE): 0 10*3/uL (ref 0.0–0.4)
Eos: 1 %
Gestational Diabetes Screen: 61 mg/dL — ABNORMAL LOW (ref 65–139)
HIV Screen 4th Generation wRfx: NONREACTIVE
Hematocrit: 32.8 % — ABNORMAL LOW (ref 34.0–46.6)
Hemoglobin: 11.1 g/dL (ref 11.1–15.9)
Immature Grans (Abs): 0 10*3/uL (ref 0.0–0.1)
Immature Granulocytes: 0 %
Lymphocytes Absolute: 1.5 10*3/uL (ref 0.7–3.1)
Lymphs: 25 %
MCH: 30.5 pg (ref 26.6–33.0)
MCHC: 33.8 g/dL (ref 31.5–35.7)
MCV: 90 fL (ref 79–97)
Monocytes Absolute: 0.4 10*3/uL (ref 0.1–0.9)
Monocytes: 7 %
Neutrophils Absolute: 4.1 10*3/uL (ref 1.4–7.0)
Neutrophils: 67 %
Platelets: 240 10*3/uL (ref 150–450)
RBC: 3.64 x10E6/uL — ABNORMAL LOW (ref 3.77–5.28)
RDW: 12.8 % (ref 11.7–15.4)
RPR Ser Ql: NONREACTIVE
WBC: 6.2 10*3/uL (ref 3.4–10.8)

## 2020-01-29 ENCOUNTER — Other Ambulatory Visit: Payer: Medicaid Other

## 2020-01-29 ENCOUNTER — Encounter: Payer: Medicaid Other | Admitting: Obstetrics & Gynecology

## 2020-02-09 ENCOUNTER — Ambulatory Visit (INDEPENDENT_AMBULATORY_CARE_PROVIDER_SITE_OTHER): Payer: Medicaid Other | Admitting: Obstetrics & Gynecology

## 2020-02-09 ENCOUNTER — Other Ambulatory Visit: Payer: Self-pay

## 2020-02-09 ENCOUNTER — Encounter: Payer: Self-pay | Admitting: Obstetrics & Gynecology

## 2020-02-09 VITALS — BP 100/60 | Wt 198.0 lb

## 2020-02-09 DIAGNOSIS — Z3403 Encounter for supervision of normal first pregnancy, third trimester: Secondary | ICD-10-CM

## 2020-02-09 DIAGNOSIS — Z3A28 28 weeks gestation of pregnancy: Secondary | ICD-10-CM

## 2020-02-09 LAB — POCT URINALYSIS DIPSTICK OB
Glucose, UA: NEGATIVE
POC,PROTEIN,UA: NEGATIVE

## 2020-02-09 NOTE — Progress Notes (Signed)
  Subjective  Fetal Movement? yes Contractions? no Leaking Fluid? no Vaginal Bleeding? no  Objective  BP 100/60   Wt 198 lb (89.8 kg)   LMP 07/18/2019 (Exact Date)   BMI 28.41 kg/m  General: NAD Pumonary: no increased work of breathing Abdomen: gravid, non-tender Extremities: no edema Psychiatric: mood appropriate, affect full  Assessment  19 y.o. G1P0 at [redacted]w[redacted]d by  05/03/2020, by Ultrasound presenting for routine prenatal visit  Plan   Problem List Items Addressed This Visit      Other   Supervision of normal first pregnancy, antepartum    Other Visit Diagnoses    [redacted] weeks gestation of pregnancy    -  Primary   Relevant Orders   POC Urinalysis Dipstick OB (Completed)      pregnancy1  Problems (from 07/18/19 to present)    Problem Noted Resolved   Supervision of normal first pregnancy, antepartum 09/29/2019 by Vena Austria, MD No   Overview Addendum 02/09/2020  2:55 PM by Nadara Mustard, MD     Clinic  [facility] Prenatal Labs  Dating LMP Blood type: B/Positive/-- (06/29 0940)   Genetic Screen SMA neg, Fragile-X neg, CF neg  NIPS:MaternTnegative neg Antibody:Negative (06/29 0940)  Anatomic [place] Complete Rubella: 4.77 (06/29 0940)  GTT Early: NA               Third trimester: 61  RPR: Non Reactive (06/29 0940)   Flu vaccine declines HBsAg: Negative (06/29 0940)   TDaP vaccine                     Rhogam:n/a HIV: Non Reactive (06/29 0940)   Baby Food                                               GBS: (For PCN allergy, check sensitivities)  Contraception  Pap: Under 21  Circumcision    Pediatrician    Support Person Plans mother and sister (FOB deceased)               Annamarie Major, MD, [facility], [facility] 02/09/2020  3:04 PM

## 2020-02-09 NOTE — Patient Instructions (Signed)
Third Trimester of Pregnancy The third trimester is from week 28 through week 40 (months 7 through 9). The third trimester is a time when the unborn baby (fetus) is growing rapidly. At the end of the ninth month, the fetus is about 20 inches in length and weighs 6-10 pounds. Body changes during your third trimester Your body will continue to go through many changes during pregnancy. The changes vary from woman to woman. During the third trimester:  Your weight will continue to increase. You can expect to gain 25-35 pounds (11-16 kg) by the end of the pregnancy.  You may begin to get stretch marks on your hips, abdomen, and breasts.  You may urinate more often because the fetus is moving lower into your pelvis and pressing on your bladder.  You may develop or continue to have heartburn. This is caused by increased hormones that slow down muscles in the digestive tract.  You may develop or continue to have constipation because increased hormones slow digestion and cause the muscles that push waste through your intestines to relax.  You may develop hemorrhoids. These are swollen veins (varicose veins) in the rectum that can itch or be painful.  You may develop swollen, bulging veins (varicose veins) in your legs.  You may have increased body aches in the pelvis, back, or thighs. This is due to weight gain and increased hormones that are relaxing your joints.  You may have changes in your hair. These can include thickening of your hair, rapid growth, and changes in texture. Some women also have hair loss during or after pregnancy, or hair that feels dry or thin. Your hair will most likely return to normal after your baby is born.  Your breasts will continue to grow and they will continue to become tender. A yellow fluid (colostrum) may leak from your breasts. This is the first milk you are producing for your baby.  Your belly button may stick out.  You may notice more swelling in your hands,  face, or ankles.  You may have increased tingling or numbness in your hands, arms, and legs. The skin on your belly may also feel numb.  You may feel short of breath because of your expanding uterus.  You may have more problems sleeping. This can be caused by the size of your belly, increased need to urinate, and an increase in your body's metabolism.  You may notice the fetus "dropping," or moving lower in your abdomen (lightening).  You may have increased vaginal discharge.  You may notice your joints feel loose and you may have pain around your pelvic bone. What to expect at prenatal visits You will have prenatal exams every 2 weeks until week 36. Then you will have weekly prenatal exams. During a routine prenatal visit:  You will be weighed to make sure you and the baby are growing normally.  Your blood pressure will be taken.  Your abdomen will be measured to track your baby's growth.  The fetal heartbeat will be listened to.  Any test results from the previous visit will be discussed.  You may have a cervical check near your due date to see if your cervix has softened or thinned (effaced).  You will be tested for Group B streptococcus. This happens between 35 and 37 weeks. Your health care provider may ask you:  What your birth plan is.  How you are feeling.  If you are feeling the baby move.  If you have had any abnormal   symptoms, such as leaking fluid, bleeding, severe headaches, or abdominal cramping.  If you are using any tobacco products, including cigarettes, chewing tobacco, and electronic cigarettes.  If you have any questions. Other tests or screenings that may be performed during your third trimester include:  Blood tests that check for low iron levels (anemia).  Fetal testing to check the health, activity level, and growth of the fetus. Testing is done if you have certain medical conditions or if there are problems during the pregnancy.  Nonstress test  (NST). This test checks the health of your baby to make sure there are no signs of problems, such as the baby not getting enough oxygen. During this test, a belt is placed around your belly. The baby is made to move, and its heart rate is monitored during movement. What is false labor? False labor is a condition in which you feel small, irregular tightenings of the muscles in the womb (contractions) that usually go away with rest, changing position, or drinking water. These are called Braxton Hicks contractions. Contractions may last for hours, days, or even weeks before true labor sets in. If contractions come at regular intervals, become more frequent, increase in intensity, or become painful, you should see your health care provider. What are the signs of labor?  Abdominal cramps.  Regular contractions that start at 10 minutes apart and become stronger and more frequent with time.  Contractions that start on the top of the uterus and spread down to the lower abdomen and back.  Increased pelvic pressure and dull back pain.  A watery or bloody mucus discharge that comes from the vagina.  Leaking of amniotic fluid. This is also known as your "water breaking." It could be a slow trickle or a gush. Let your health care provider know if it has a color or strange odor. If you have any of these signs, call your health care provider right away, even if it is before your due date. Follow these instructions at home: Medicines  Follow your health care provider's instructions regarding medicine use. Specific medicines may be either safe or unsafe to take during pregnancy.  Take a prenatal vitamin that contains at least 600 micrograms (mcg) of folic acid.  If you develop constipation, try taking a stool softener if your health care provider approves. Eating and drinking   Eat a balanced diet that includes fresh fruits and vegetables, whole grains, good sources of protein such as meat, eggs, or tofu,  and low-fat dairy. Your health care provider will help you determine the amount of weight gain that is right for you.  Avoid raw meat and uncooked cheese. These carry germs that can cause birth defects in the baby.  If you have low calcium intake from food, talk to your health care provider about whether you should take a daily calcium supplement.  Eat four or five small meals rather than three large meals a day.  Limit foods that are high in fat and processed sugars, such as fried and sweet foods.  To prevent constipation: ? Drink enough fluid to keep your urine clear or pale yellow. ? Eat foods that are high in fiber, such as fresh fruits and vegetables, whole grains, and beans. Activity  Exercise only as directed by your health care provider. Most women can continue their usual exercise routine during pregnancy. Try to exercise for 30 minutes at least 5 days a week. Stop exercising if you experience uterine contractions.  Avoid heavy lifting.  Do   not exercise in extreme heat or humidity, or at high altitudes.  Wear low-heel, comfortable shoes.  Practice good posture.  You may continue to have sex unless your health care provider tells you otherwise. Relieving pain and discomfort  Take frequent breaks and rest with your legs elevated if you have leg cramps or low back pain.  Take warm sitz baths to soothe any pain or discomfort caused by hemorrhoids. Use hemorrhoid cream if your health care provider approves.  Wear a good support bra to prevent discomfort from breast tenderness.  If you develop varicose veins: ? Wear support pantyhose or compression stockings as told by your healthcare provider. ? Elevate your feet for 15 minutes, 3-4 times a day. Prenatal care  Write down your questions. Take them to your prenatal visits.  Keep all your prenatal visits as told by your health care provider. This is important. Safety  Wear your seat belt at all times when driving.  Make  a list of emergency phone numbers, including numbers for family, friends, the hospital, and police and fire departments. General instructions  Avoid cat litter boxes and soil used by cats. These carry germs that can cause birth defects in the baby. If you have a cat, ask someone to clean the litter box for you.  Do not travel far distances unless it is absolutely necessary and only with the approval of your health care provider.  Do not use hot tubs, steam rooms, or saunas.  Do not drink alcohol.  Do not use any products that contain nicotine or tobacco, such as cigarettes and e-cigarettes. If you need help quitting, ask your health care provider.  Do not use any medicinal herbs or unprescribed drugs. These chemicals affect the formation and growth of the baby.  Do not douche or use tampons or scented sanitary pads.  Do not cross your legs for long periods of time.  To prepare for the arrival of your baby: ? Take prenatal classes to understand, practice, and ask questions about labor and delivery. ? Make a trial run to the hospital. ? Visit the hospital and tour the maternity area. ? Arrange for maternity or paternity leave through employers. ? Arrange for family and friends to take care of pets while you are in the hospital. ? Purchase a rear-facing car seat and make sure you know how to install it in your car. ? Pack your hospital bag. ? Prepare the baby's nursery. Make sure to remove all pillows and stuffed animals from the baby's crib to prevent suffocation.  Visit your dentist if you have not gone during your pregnancy. Use a soft toothbrush to brush your teeth and be gentle when you floss. Contact a health care provider if:  You are unsure if you are in labor or if your water has broken.  You become dizzy.  You have mild pelvic cramps, pelvic pressure, or nagging pain in your abdominal area.  You have lower back pain.  You have persistent nausea, vomiting, or  diarrhea.  You have an unusual or bad smelling vaginal discharge.  You have pain when you urinate. Get help right away if:  Your water breaks before 37 weeks.  You have regular contractions less than 5 minutes apart before 37 weeks.  You have a fever.  You are leaking fluid from your vagina.  You have spotting or bleeding from your vagina.  You have severe abdominal pain or cramping.  You have rapid weight loss or weight gain.  You have   shortness of breath with chest pain.  You notice sudden or extreme swelling of your face, hands, ankles, feet, or legs.  Your baby makes fewer than 10 movements in 2 hours.  You have severe headaches that do not go away when you take medicine.  You have vision changes. Summary  The third trimester is from week 28 through week 40, months 7 through 9. The third trimester is a time when the unborn baby (fetus) is growing rapidly.  During the third trimester, your discomfort may increase as you and your baby continue to gain weight. You may have abdominal, leg, and back pain, sleeping problems, and an increased need to urinate.  During the third trimester your breasts will keep growing and they will continue to become tender. A yellow fluid (colostrum) may leak from your breasts. This is the first milk you are producing for your baby.  False labor is a condition in which you feel small, irregular tightenings of the muscles in the womb (contractions) that eventually go away. These are called Braxton Hicks contractions. Contractions may last for hours, days, or even weeks before true labor sets in.  Signs of labor can include: abdominal cramps; regular contractions that start at 10 minutes apart and become stronger and more frequent with time; watery or bloody mucus discharge that comes from the vagina; increased pelvic pressure and dull back pain; and leaking of amniotic fluid. This information is not intended to replace advice given to you by your  health care provider. Make sure you discuss any questions you have with your health care provider. Document Revised: 07/10/2018 Document Reviewed: 04/24/2016 Elsevier Patient Education  2020 [employer]  

## 2020-02-24 ENCOUNTER — Encounter: Payer: Medicaid Other | Admitting: Obstetrics

## 2020-03-08 ENCOUNTER — Other Ambulatory Visit: Payer: Self-pay

## 2020-03-08 ENCOUNTER — Encounter: Payer: Self-pay | Admitting: Advanced Practice Midwife

## 2020-03-08 ENCOUNTER — Ambulatory Visit (INDEPENDENT_AMBULATORY_CARE_PROVIDER_SITE_OTHER): Payer: BC Managed Care – PPO | Admitting: Advanced Practice Midwife

## 2020-03-08 VITALS — BP 110/70 | Ht 70.0 in | Wt 201.4 lb

## 2020-03-08 DIAGNOSIS — Z3403 Encounter for supervision of normal first pregnancy, third trimester: Secondary | ICD-10-CM

## 2020-03-08 DIAGNOSIS — Z3A32 32 weeks gestation of pregnancy: Secondary | ICD-10-CM

## 2020-03-08 DIAGNOSIS — Z23 Encounter for immunization: Secondary | ICD-10-CM

## 2020-03-08 LAB — POCT URINALYSIS DIPSTICK OB: Glucose, UA: NEGATIVE

## 2020-03-08 NOTE — Progress Notes (Signed)
  Routine Prenatal Care Visit  Subjective  Gwendolyn Garrett is a 19 y.o. G1P0 at [redacted]w[redacted]d being seen today for ongoing prenatal care.  She is currently monitored for the following issues for this low-risk pregnancy and has Supervision of normal first pregnancy, antepartum on their problem list.  ----------------------------------------------------------------------------------- Patient reports no complaints.   Contractions: Not present. Vag. Bleeding: None.  Movement: Present. Leaking Fluid denies.  ----------------------------------------------------------------------------------- The following portions of the patient's history were reviewed and updated as appropriate: allergies, current medications, past family history, past medical history, past social history, past surgical history and problem list. Problem list updated.  Objective  Blood pressure 110/70, height 5\' 10"  (1.778 m), weight 201 lb 6.4 oz (91.4 kg), last menstrual period 07/18/2019. Pregravid weight 185 lb (83.9 kg) Total Weight Gain 16 lb 6.4 oz (7.439 kg) Urinalysis: Urine Protein Trace  Urine Glucose Negative  Fetal Status: Fetal Heart Rate (bpm): 146 Fundal Height: 32 cm Movement: Present     General:  Alert, oriented and cooperative. Patient is in no acute distress.  Skin: Skin is warm and dry. No rash noted.   Cardiovascular: Normal heart rate noted  Respiratory: Normal respiratory effort, no problems with respiration noted  Abdomen: Soft, gravid, appropriate for gestational age. Pain/Pressure: Absent     Pelvic:  Cervical exam deferred        Extremities: Normal range of motion.  Edema: None  Mental Status: Normal mood and affect. Normal behavior. Normal judgment and thought content.   Assessment   19 y.o. G1P0 at [redacted]w[redacted]d by  05/03/2020, by Ultrasound presenting for routine prenatal visit  Plan   pregnancy1  Problems (from 07/18/19 to present)    Problem Noted Resolved   Supervision of normal first pregnancy,  antepartum 09/29/2019 by 10/01/2019, MD No   Overview Addendum 02/09/2020  3:06 PM by 13/12/2019, MD     Clinic  [facility] Prenatal Labs  Dating LMP Blood type: B/Positive/-- (06/29 0940)   Genetic Screen SMA neg, Fragile-X neg, CF neg  NIPS:MaternTnegative neg Antibody:Negative (06/29 0940)  Anatomic 07[birth year]1984 Complete Rubella: 4.77 (06/29 0940)  GTT Early: NA               Third trimester: 61  RPR: Non Reactive (06/29 0940)   Flu vaccine declines HBsAg: Negative (06/29 0940)   TDaP vaccine                     Rhogam:n/a HIV: Non Reactive (06/29 0940)   Baby Food                                               GBS: (For PCN allergy, check sensitivities)  Contraception  Pap: Under 21  Circumcision    Pediatrician    Support Person Mother and sister, (FOB deceased)         Previous Version       Preterm labor symptoms and general obstetric precautions including but not limited to vaginal bleeding, contractions, leaking of fluid and fetal movement were reviewed in detail with the patient.  Return in about 2 weeks (around 03/22/2020) for rob.  03/24/2020, CNM 03/08/2020 8:22 AM

## 2020-03-22 ENCOUNTER — Encounter: Payer: Medicaid Other | Admitting: Obstetrics

## 2020-03-23 ENCOUNTER — Encounter: Payer: Medicaid Other | Admitting: Obstetrics and Gynecology

## 2020-04-02 NOTE — L&D Delivery Note (Signed)
Delivery Note Primary OB: [facility] Delivery Physician: Annamarie Major, MD Gestational Age: Full term Antepartum complications: post-term Intrapartum complications: None  A viable Female was delivered via vertex presentation. "Saige" Apgars:8 ,9  Weight:  pending .   Placenta status: spontaneous and Intact.  Cord: 3+ vessels;  with the following complications: nuchal.  Anesthesia:  epidural Episiotomy:  none Lacerations:  1st Suture Repair: 2.0 vicryl Est. Blood Loss (mL):  300 mL  Mom to postpartum.  Baby to Couplet care / Skin to Skin.  Annamarie Major, MD, [facility], [facility] 05/07/2020  11:03 PM [phone]

## 2020-04-11 ENCOUNTER — Other Ambulatory Visit: Payer: Self-pay

## 2020-04-11 ENCOUNTER — Other Ambulatory Visit (HOSPITAL_COMMUNITY)
Admission: RE | Admit: 2020-04-11 | Discharge: 2020-04-11 | Disposition: A | Discharge status: Home / Self Care | Payer: BC Managed Care – PPO | Source: Ambulatory Visit | Attending: Obstetrics and Gynecology | Admitting: Obstetrics and Gynecology

## 2020-04-11 ENCOUNTER — Encounter: Payer: Self-pay | Admitting: Obstetrics and Gynecology

## 2020-04-11 ENCOUNTER — Ambulatory Visit (INDEPENDENT_AMBULATORY_CARE_PROVIDER_SITE_OTHER): Payer: BC Managed Care – PPO | Admitting: Obstetrics and Gynecology

## 2020-04-11 VITALS — BP 116/70 | Ht 70.0 in | Wt 208.8 lb

## 2020-04-11 DIAGNOSIS — Z3A36 36 weeks gestation of pregnancy: Secondary | ICD-10-CM

## 2020-04-11 DIAGNOSIS — Z3403 Encounter for supervision of normal first pregnancy, third trimester: Secondary | ICD-10-CM

## 2020-04-11 DIAGNOSIS — Z3400 Encounter for supervision of normal first pregnancy, unspecified trimester: Secondary | ICD-10-CM

## 2020-04-11 NOTE — Patient Instructions (Signed)
Vaginal Delivery  Vaginal delivery means that you give birth by pushing your baby out of your birth canal (vagina). A team of health care providers will help you before, during, and after vaginal delivery. Birth experiences are unique for every woman and every pregnancy, and birth experiences vary depending on where you choose to give birth. What happens when I arrive at the birth center or hospital? Once you are in labor and have been admitted into the hospital or birth center, your health care provider may:  Review your pregnancy history and any concerns that you have.  Insert an IV into one of your veins. This may be used to give you fluids and medicines.  Check your blood pressure, pulse, temperature, and heart rate (vital signs).  Check whether your bag of water (amniotic sac) has broken (ruptured).  Talk with you about your birth plan and discuss pain control options. Monitoring Your health care provider may monitor your contractions (uterine monitoring) and your baby's heart rate (fetal monitoring). You may need to be monitored:  Often, but not continuously (intermittently).  All the time or for long periods at a time (continuously). Continuous monitoring may be needed if: ? You are taking certain medicines, such as medicine to relieve pain or make your contractions stronger. ? You have pregnancy or labor complications. Monitoring may be done by:  Placing a special stethoscope or a handheld monitoring device on your abdomen to check your baby's heartbeat and to check for contractions.  Placing monitors on your abdomen (external monitors) to record your baby's heartbeat and the frequency and length of contractions.  Placing monitors inside your uterus through your vagina (internal monitors) to record your baby's heartbeat and the frequency, length, and strength of your contractions. Depending on the type of monitor, it may remain in your uterus or on your baby's head until  birth.  Telemetry. This is a type of continuous monitoring that can be done with external or internal monitors. Instead of having to stay in bed, you are able to move around during telemetry. Physical exam Your health care provider may perform frequent physical exams. This may include:  Checking how and where your baby is positioned in your uterus.  Checking your cervix to determine: ? Whether it is thinning out (effacing). ? Whether it is opening up (dilating). What happens during labor and delivery? Normal labor and delivery is divided into the following three stages: Stage 1  This is the longest stage of labor.  This stage can last for hours or days.  Throughout this stage, you will feel contractions. Contractions generally feel mild, infrequent, and irregular at first. They get stronger, more frequent (about every 2-3 minutes), and more regular as you move through this stage.  This stage ends when your cervix is completely dilated to 4 inches (10 cm) and completely effaced. Stage 2  This stage starts once your cervix is completely effaced and dilated and lasts until the delivery of your baby.  This stage may last from 20 minutes to 2 hours.  This is the stage where you will feel an urge to push your baby out of your vagina.  You may feel stretching and burning pain, especially when the widest part of your baby's head passes through the vaginal opening (crowning).  Once your baby is delivered, the umbilical cord will be clamped and cut. This usually occurs after waiting a period of 1-2 minutes after delivery.  Your baby will be placed on your bare chest (skin-to-skin   contact) in an upright position and covered with a warm blanket. Watch your baby for feeding cues, like rooting or sucking, and help the baby to your breast for his or her first feeding. Stage 3  This stage starts immediately after the birth of your baby and ends after you deliver the placenta.  This stage may  take anywhere from 5 to 30 minutes.  After your baby has been delivered, you will feel contractions as your body expels the placenta and your uterus contracts to control bleeding.   What can I expect after labor and delivery?  After labor is over, you and your baby will be monitored closely until you are ready to go home to ensure that you are both healthy. Your health care team will teach you how to care for yourself and your baby.  You and your baby will stay in the same room (rooming in) during your hospital stay. This will encourage early bonding and successful breastfeeding.  You may continue to receive fluids and medicines through an IV.  Your uterus will be checked and massaged regularly (fundal massage).  You will have some soreness and pain in your abdomen, vagina, and the area of skin between your vaginal opening and your anus (perineum).  If an incision was made near your vagina (episiotomy) or if you had some vaginal tearing during delivery, cold compresses may be placed on your episiotomy or your tear. This helps to reduce pain and swelling.  You may be given a squirt bottle to use instead of wiping when you go to the bathroom. To use the squirt bottle, follow these steps: ? Before you urinate, fill the squirt bottle with warm water. Do not use hot water. ? After you urinate, while you are sitting on the toilet, use the squirt bottle to rinse the area around your urethra and vaginal opening. This rinses away any urine and blood. ? Fill the squirt bottle with clean water every time you use the bathroom.  It is normal to have vaginal bleeding after delivery. Wear a sanitary pad for vaginal bleeding and discharge. Summary  Vaginal delivery means that you will give birth by pushing your baby out of your birth canal (vagina).  Your health care provider may monitor your contractions (uterine monitoring) and your baby's heart rate (fetal monitoring).  Your health care provider may  perform a physical exam.  Normal labor and delivery is divided into three stages.  After labor is over, you and your baby will be monitored closely until you are ready to go home. This information is not intended to replace advice given to you by your health care provider. Make sure you discuss any questions you have with your health care provider. Document Revised: 04/23/2017 Document Reviewed: 04/23/2017 Elsevier Patient Education  2021 [employer] Pain Relief During Labor and Delivery Many things can cause pain during labor and delivery, including:  Pressure due to the baby moving through the pelvis.  Stretching of tissues due to the baby moving through the birth canal.  Muscle tension due to anxiety or nervousness.  The uterus tightening (contracting)and relaxing to help move the baby. How do I get pain relief during labor and delivery? Discuss your pain relief options with your health care provider during your prenatal visits. Explore the options offered by your hospital or birth center. There are many ways to deal with the pain of labor and delivery. You can try relaxation techniques or doing relaxing activities, taking a warm shower or   bath (hydrotherapy), or other methods. There are also many medicines available to help control pain. Relaxation techniques and activities Practice relaxation techniques or do relaxing activities, such as:  Focused breathing.  Meditation.  Visualization.  Aroma therapy.  Listening to your favorite music.  Hypnosis. Hydrotherapy Take a warm shower or bath. This may:  Provide comfort and relaxation.  Lessen your feeling of pain.  Reduce the amount of pain medicine needed.  Shorten the length of labor. Other methods Try doing other things, such as:  Getting a massage or having counterpressure on your back.  Applying warm packs or ice packs.  Changing positions often, moving around, or using a birthing ball. Medicines You may  be given:  Pain medicine through an IV or an injection into a muscle.  Pain medicine inserted into your spinal column.  Injections of sterile water just under the skin on your lower back.  Nitrous oxide inhalation therapy, also called laughing gas.   What kinds of medicine are available for pain relief? There are two kinds of medicines that can be used to relieve pain during labor and delivery:  Analgesics. These medicines decrease pain without causing you to lose feeling or the ability to move your muscles.  Anesthetics. These medicines block feeling in the body and can decrease your ability to move freely. Both kinds of medicine can cause minor side effects, such as nausea, trouble concentrating, and sleepiness. They can also affect the baby's heart rate before birth and his or her breathing after birth. For this reason, health care providers are careful about when and how much medicine is given. Which medicines are used to provide pain relief? Common medicines The most common medicines used to help manage pain during labor and delivery include:  Opioids. Opioids are medicines that decrease how much pain you feel (perception of pain). These medicines can be given through an IV or may be used with anesthetics to block pain.  Epidural analgesia. ? Epidural analgesia is given through a very thin tube that is inserted into the lower back. Medicine is delivered continuously to the area near your spinal column nerves (epidural space). After having this treatment, you may be able to move your legs, but you will not be able to walk. Depending on the amount and type of medicine given, you may lose all feeling in the lower half of your body, or you may have some sensation, including the urge to push. This treatment can be used to give pain relief for a vaginal birth. ? Sometimes, a numbing medicine is injected into the spinal fluid when an epidural catheter is placed. This provides for immediate  relief but only lasts for 1-2 hours. Once it wears off, the epidural will provide pain relief. This is called a combined spinal-epidural (CSE) block.  Intrathecal analgesia (spinal analgesia). Intrathecal analgesia is similar to epidural analgesia, but the medicine is injected into the spinal fluid instead of the epidural space. It is usually only given once. It starts to relieve pain quickly, but the pain relief lasts only 1-2 hours.  Pudendal block. This block is done by injecting numbing medicine through the wall of the vagina and into a nerve in the pelvis. Other medicines Other medicines used to help manage pain during labor and delivery include:  Local anesthetics. These are used to numb a small area of the body. They may be used along with another kind of medicine or used to numb the nerves of the vagina, cervix, and perineum during   perineum during the second stage of labor.  Spinal block (spinal anesthesia). Spinal anesthesia is similar to spinal analgesia, but the medicine that is used contains longer-acting numbing medicines and pain medicines. This type of anesthesia can be used for a cesarean delivery and allows you to stay awake for the birth of your baby.  General anesthetics cause you to lose consciousness so you do not feel pain. They are usually only used for an emergency cesarean delivery. These medicines are given through an IV or a mask or both. These medicines are used as part of a procedure or for an emergency delivery. Summary  Women have many options to help them manage the pain associated with labor and delivery.  You can try doing relaxing activities, taking a warm shower or bath, or other methods.  There are also many medicines available to help control pain during labor and delivery.  Talk with your health care provider about what options are available to you. This information is not intended to replace advice given to you by your health care provider. Make sure you discuss any  questions you have with your health care provider. Document Revised: 02/04/2019 Document Reviewed: 02/04/2019 Elsevier Patient Education  2021 [employer] Inc[employer]   Augmentation of Labor Augmentation of labor is when steps are taken to stimulate and strengthen contractions of the uterus during labor. This may be done when contractions have slowed or stopped, and the progress of labor and delivery of the baby is delayed. Before augmentation of labor begins, these factors will be considered:  The mother's medical condition and the baby's condition.  The size and position of the baby.  The size of the birth canal. Tell a health care provider about:  Any allergies you have.  All medicines you are taking, including vitamins, herbs, eye drops, creams, and over-the-counter medicines.  Any problems you or your family members have had with anesthetic medicines.  Any surgeries you have had.  Any blood disorders you have.  Any medical conditions you have. What are the risks? Generally, this is a safe procedure. However, problems may occur, including:  Tearing (rupture) of the uterus.  Breaking off (abruption) of the placenta.  Increased risk of infection for you and your baby.  Increased risk of cesarean, forceps, or vacuum delivery.  Excessive bleeding after delivery (postpartum hemorrhage).  Umbilical cord prolapse. This can cause the umbilical cord to get squeezed during birth.  Too much stimulation of the contractions. This can result in continuous, prolonged, or very strong contractions.  Your baby could fail to get enough blood flow or oxygen. This can be life-threatening (fetal death). What happens during the procedure? Augmentation of labor may include:  Giving you medicine that stimulates contractions (oxytocin). This is given through an IV that is inserted into a vein in your arm.  Breaking the fluid-filled sac that surrounds the fetus (amniotic sac). This procedure is  called artificial rupture of membranes. This procedure may vary among health care providers and hospitals.   Summary  Augmentation of labor is when steps are taken to stimulate and strengthen contractions of the uterus during labor. This may be done when contractions have slowed down or stopped, and the progress of labor and delivery of the baby is delayed.  Labor augmentation may be done using medicine to stimulate contractions (oxytocin). Or it can be done by breaking the fluid-filled sac that surrounds the fetus (amniotic sac).  Generally, this is a safe procedure. However, problems can occur. Talk with your  health care provider about the potential risks and benefits of labor augmentation if this is offered to you. This information is not intended to replace advice given to you by your health care provider. Make sure you discuss any questions you have with your health care provider. Document Revised: 12/31/2019 Document Reviewed: 12/31/2019 Elsevier Patient Education  2021 Else[employer] Inc.   

## 2020-04-11 NOTE — Progress Notes (Signed)
    Routine Prenatal Care Visit  Subjective  Gwendolyn Garrett is a 20 y.o. G1P0 at [redacted]w[redacted]d being seen today for ongoing prenatal care.  She is currently monitored for the following issues for this low-risk pregnancy and has Supervision of normal first pregnancy, antepartum on their problem list.  ----------------------------------------------------------------------------------- Patient reports no complaints.   Contractions: Not present. Vag. Bleeding: None.  Movement: Present. Denies leaking of fluid.  ----------------------------------------------------------------------------------- The following portions of the patient's history were reviewed and updated as appropriate: allergies, current medications, past family history, past medical history, past social history, past surgical history and problem list. Problem list updated.   Objective  Blood pressure 116/70, height 5\' 10"  (1.778 m), weight 208 lb 12.8 oz (94.7 kg), last menstrual period 07/18/2019. Pregravid weight 185 lb (83.9 kg) Total Weight Gain 23 lb 12.8 oz (10.8 kg) Urinalysis:      Fetal Status: Fetal Heart Rate (bpm): 140 Fundal Height: 36 cm Movement: Present  Presentation: Vertex  General:  Alert, oriented and cooperative. Patient is in no acute distress.  Skin: Skin is warm and dry. No rash noted.   Cardiovascular: Normal heart rate noted  Respiratory: Normal respiratory effort, no problems with respiration noted  Abdomen: Soft, gravid, appropriate for gestational age. Pain/Pressure: Absent     Pelvic:  Cervical exam deferred Dilation: 1 Effacement (%): 50 Station: -3  Extremities: Normal range of motion.  Edema: None  Mental Status: Normal mood and affect. Normal behavior. Normal judgment and thought content.     Assessment   20 y.o. G1P0 at [redacted]w[redacted]d by  05/03/2020, by Ultrasound presenting for routine prenatal visit  Plan   pregnancy1  Problems (from 07/18/19 to present)    Problem Noted Resolved   Supervision of  normal first pregnancy, antepartum 09/29/2019 by 10/01/2019, MD No   Overview Addendum 04/11/2020  5:08 PM by 06/09/2020, MD     Clinic  [facility] Prenatal Labs  Dating LMP Blood type: B/Positive/-- (06/29 0940)   Genetic Screen SMA neg, Fragile-X neg, CF neg  NIPS:MaternTnegative neg Antibody:Negative (06/29 0940)  Anatomic 07-03-1984 Complete Rubella: 4.77 (06/29 0940)  GTT Early: NA               Third trimester: 61  RPR: Non Reactive (06/29 0940)   Flu vaccine declines HBsAg: Negative (06/29 0940)   TDaP vaccine   03/08/2020                   Rhogam:n/a HIV: Non Reactive (06/29 0940)   Baby Food       Breast                                        GBS: (For PCN allergy, check sensitivities)  Contraception  undecided Pap: Under 21  Circumcision    Pediatrician    Support Person Mother and sister, (FOB deceased)         Previous Version      GBS GC/CT today  Gestational age appropriate obstetric precautions including but not limited to vaginal bleeding, contractions, leaking of fluid and fetal movement were reviewed in detail with the patient.    Return in about 1 week (around 04/18/2020) for ROB in person.  04/20/2020 MD [facility], [facility] 04/11/2020, 5:08 PM

## 2020-04-11 NOTE — Progress Notes (Signed)
ROB [phone]

## 2020-04-14 LAB — CERVICOVAGINAL ANCILLARY ONLY
Chlamydia: NEGATIVE
Comment: NEGATIVE
Comment: NORMAL
Neisseria Gonorrhea: NEGATIVE

## 2020-04-15 LAB — CULTURE, BETA STREP (GROUP B ONLY): Strep Gp B Culture: POSITIVE — AB

## 2020-04-18 ENCOUNTER — Encounter: Payer: BC Managed Care – PPO | Admitting: Obstetrics

## 2020-04-19 ENCOUNTER — Ambulatory Visit (INDEPENDENT_AMBULATORY_CARE_PROVIDER_SITE_OTHER): Payer: BC Managed Care – PPO | Admitting: Obstetrics

## 2020-04-19 ENCOUNTER — Other Ambulatory Visit: Payer: Self-pay

## 2020-04-19 VITALS — BP 112/70 | Ht 70.0 in | Wt 209.6 lb

## 2020-04-19 DIAGNOSIS — Z3400 Encounter for supervision of normal first pregnancy, unspecified trimester: Secondary | ICD-10-CM

## 2020-04-19 DIAGNOSIS — Z3A38 38 weeks gestation of pregnancy: Secondary | ICD-10-CM

## 2020-04-19 NOTE — Progress Notes (Signed)
ROB 38w

## 2020-04-19 NOTE — Progress Notes (Signed)
Routine Prenatal Care Visit  Subjective  Gwendolyn Garrett is a 20 y.o. G1P0 at [redacted]w[redacted]d being seen today for ongoing prenatal care.  She is currently monitored for the following issues for this low-risk pregnancy and has Supervision of normal first pregnancy, antepartum on their problem list.  ----------------------------------------------------------------------------------- Patient reports no complaints.  She desires a vag exam today Contractions: Not present. Vag. Bleeding: None.  Movement: Present. Leaking Fluid denies.  ----------------------------------------------------------------------------------- The following portions of the patient's history were reviewed and updated as appropriate: allergies, current medications, past family history, past medical history, past social history, past surgical history and problem list. Problem list updated.  Objective  Blood pressure 112/70, height 5\' 10"  (1.778 m), weight 209 lb 9.6 oz (95.1 kg), last menstrual period 07/18/2019. Pregravid weight 185 lb (83.9 kg) Total Weight Gain 24 lb 9.6 oz (11.2 kg) Urinalysis: Urine Protein    Urine Glucose    Fetal Status:     Movement: Present     General:  Alert, oriented and cooperative. Patient is in no acute distress.  Skin: Skin is warm and dry. No rash noted.   Cardiovascular: Normal heart rate noted  Respiratory: Normal respiratory effort, no problems with respiration noted  Abdomen: Soft, gravid, appropriate for gestational age. Pain/Pressure: Absent     Pelvic:  Cervical exam performed      1.5 cms/70%/-2 cervix is posterior.  Extremities: Normal range of motion.     Mental Status: Normal mood and affect. Normal behavior. Normal judgment and thought content.   Assessment   19 y.o. G1P0 at [redacted]w[redacted]d by  05/03/2020, by Ultrasound presenting for routine prenatal visit  Plan   pregnancy1  Problems (from 07/18/19 to present)    Problem Noted Resolved   Supervision of normal first pregnancy,  antepartum 09/29/2019 by 10/01/2019, MD No   Overview Addendum 04/19/2020  2:42 PM by 04/21/2020, CNM     Clinic  [facility] Prenatal Labs  Dating LMP Blood type: B/Positive/-- (06/29 0940)   Genetic Screen SMA neg, Fragile-X neg, CF neg  NIPS:MaternTnegative neg Antibody:Negative (06/29 0940)  Anatomic 07-03-1984 Complete Rubella: 4.77 (06/29 0940)  GTT Early: NA               Third trimester: 60  RPR: Non Reactive (06/29 0940)   Flu vaccine declines HBsAg: Negative (06/29 0940)   TDaP vaccine   03/08/2020                   Rhogam:n/a HIV: Non Reactive (06/29 0940)   Baby Food       Breast                                        GBS positive  Contraception  undecided Pap: Under 21  Circumcision    Pediatrician    Support Person Mother and sister, (FOB deceased)         Previous Version       Term labor symptoms and general obstetric precautions including but not limited to vaginal bleeding, contractions, leaking of fluid and fetal movement were reviewed in detail with the patient. Please refer to After Visit Summary for other counseling recommendations.  Discussed her GBS status and that she is PCN allergic. [facility] contacted via phone- verbal order to perform sensitivity made by this provider  Return in about 1 week (around 04/26/2020) for return OB.  04/28/2020  Eunice Blase, CNM  04/19/2020 2:52 PM

## 2020-04-27 ENCOUNTER — Ambulatory Visit (INDEPENDENT_AMBULATORY_CARE_PROVIDER_SITE_OTHER): Payer: BC Managed Care – PPO | Admitting: Obstetrics

## 2020-04-27 ENCOUNTER — Other Ambulatory Visit: Payer: Self-pay

## 2020-04-27 ENCOUNTER — Encounter: Payer: BC Managed Care – PPO | Admitting: Obstetrics

## 2020-04-27 VITALS — BP 100/60 | Wt 212.0 lb

## 2020-04-27 DIAGNOSIS — Z3400 Encounter for supervision of normal first pregnancy, unspecified trimester: Secondary | ICD-10-CM

## 2020-04-27 DIAGNOSIS — Z3A39 39 weeks gestation of pregnancy: Secondary | ICD-10-CM

## 2020-04-27 LAB — POCT URINALYSIS DIPSTICK OB
Glucose, UA: NEGATIVE
POC,PROTEIN,UA: NEGATIVE

## 2020-04-27 NOTE — Progress Notes (Signed)
C/o - irreg ctxs; would like cx ck and membranes stripped; vaginal pressure.rj

## 2020-04-27 NOTE — Progress Notes (Signed)
Routine Prenatal Care Visit  Subjective  Gwendolyn Garrett is a 20 y.o. G1P0 at [redacted]w[redacted]d being seen today for ongoing prenatal care.  She is currently monitored for the following issues for this low-risk pregnancy and has Supervision of normal first pregnancy, antepartum on their problem list.  ----------------------------------------------------------------------------------- Patient reports no complaints.  She would like to be checked today as she is over [redacted] weeks gestation. She is GBS Positive, and a phone order was made to get a sensitivity result  .  .   . Leaking Fluid denies.  ----------------------------------------------------------------------------------- The following portions of the patient's history were reviewed and updated as appropriate: allergies, current medications, past family history, past medical history, past social history, past surgical history and problem list. Problem list updated.  Objective  Blood pressure 100/60, weight 212 lb (96.2 kg), last menstrual period 07/18/2019. Pregravid weight 185 lb (83.9 kg) Total Weight Gain 27 lb (12.2 kg) Urinalysis: Urine Protein Negative  Urine Glucose Negative  Fetal Status:           General:  Alert, oriented and cooperative. Patient is in no acute distress.  Skin: Skin is warm and dry. No rash noted.   Cardiovascular: Normal heart rate noted  Respiratory: Normal respiratory effort, no problems with respiration noted  Abdomen: Soft, gravid, appropriate for gestational age.       Pelvic:  Cervical exam performed      tight 2 cms/70% effaced/-2. Cervix is slightly to her right, midway, and swept today.  Extremities: Normal range of motion.     Mental Status: Normal mood and affect. Normal behavior. Normal judgment and thought content.   Assessment   20 y.o. G1P0 at [redacted]w[redacted]d by  05/03/2020, by Ultrasound presenting for routine prenatal visit  Plan   pregnancy1  Problems (from 07/18/19 to present)    Problem Noted Resolved    Supervision of normal first pregnancy, antepartum 09/29/2019 by Vena Austria, MD No   Overview Addendum 04/27/2020  1:35 PM by Mirna Mires, CNM     [facility]inic  [facility] Prenatal Labs  Dating LMP Blood type: B/Positive/-- (06/29 0940)   Genetic Screen SMA neg, Fragile-X neg, CF neg  NIPS:MaternTnegative neg Antibody:Negative (06/29 0940)  Anatomic [place] Complete Rubella: 4.77 (06/29 0940)  GTT Early: NA               Third trimester: 61  RPR: Non Reactive (06/29 0940)   Flu vaccine declines HBsAg: Negative (06/29 0940)   TDaP vaccine   03/08/2020                   Rhogam:n/a HIV: Non Reactive (06/29 0940)   Baby Food       Breast                                        GBS positive-PCN allergy-sensitivity ordered.  Contraception  undecided Pap: Under 21  Circumcision    Pediatrician    Support Person Mother and sister, (FOB deceased)         Previous Version       Term labor symptoms and general obstetric precautions including but not limited to vaginal bleeding, contractions, leaking of fluid and fetal movement were reviewed in detail with the patient. Please refer to After Visit Summary for other counseling recommendations. We carefully reviewed sxs of active vs latent phase labor.  Thorough cervical sweep today. The possibility  of IOL prior to 41 weeks also discussed. She will return to the office early next week for another sweep unless she goes into labor on her own.  No follow-ups on file.  Mirna Mires, CNM  04/27/2020 1:36 PM

## 2020-05-03 ENCOUNTER — Ambulatory Visit (INDEPENDENT_AMBULATORY_CARE_PROVIDER_SITE_OTHER): Payer: BC Managed Care – PPO | Admitting: Obstetrics and Gynecology

## 2020-05-03 ENCOUNTER — Other Ambulatory Visit: Payer: Self-pay

## 2020-05-03 ENCOUNTER — Inpatient Hospital Stay (HOSPITAL_COMMUNITY): Admit: 2020-05-03 | Payer: Self-pay

## 2020-05-03 VITALS — BP 112/68 | Wt 215.0 lb

## 2020-05-03 DIAGNOSIS — Z3A40 40 weeks gestation of pregnancy: Secondary | ICD-10-CM

## 2020-05-03 DIAGNOSIS — Z3400 Encounter for supervision of normal first pregnancy, unspecified trimester: Secondary | ICD-10-CM

## 2020-05-03 LAB — POCT URINALYSIS DIPSTICK OB
Glucose, UA: NEGATIVE
POC,PROTEIN,UA: NEGATIVE

## 2020-05-03 NOTE — Patient Instructions (Signed)
[facility] 8:00-10:00 COVID test on 05/04/2020

## 2020-05-03 NOTE — Progress Notes (Signed)
Obstetric H&P   Chief Complaint: ROB postdates induction scheduling  Prenatal Care Provider: [facility]  History of Present Illness: 20 y.o. G1P0 [redacted]w[redacted]d by 05/03/2020, by Ultrasound presenting for ROB visit today and scheduling of postdates IOL.  +FM, no LOF, no VB, irregular contractions.   Pregnancy uncomplicated to date.  The patient did suffer loss of her significant other in September of 2021.  Pregravid weight 185 lb (83.9 kg) Total Weight Gain 30 lb (13.6 kg)  pregnancy1  Problems (from 07/18/19 to present)    Problem Noted Resolved   Supervision of normal first pregnancy, antepartum 09/29/2019 by Vena Austria, MD No   Overview Addendum 04/27/2020  5:28 PM by Mirna Mires, CNM     Clinic  [facility] Prenatal Labs  Dating LMP Blood type: B/Positive/-- (06/29 0940)   Genetic Screen SMA neg, Fragile-X neg, CF neg  NIPS:MaternTnegative neg Antibody:Negative (06/29 0940)  Anatomic [place] Complete Rubella: 4.77 (06/29 0940)  GTT Early: NA               Third trimester: 61  RPR: Non Reactive (06/29 0940)   Flu vaccine declines HBsAg: Negative (06/29 0940)   TDaP vaccine   03/08/2020                   Rhogam:n/a HIV: Non Reactive (06/29 0940)   Baby Food Breast                                        GBS positive-PCN allergy-Clindamycin susceptible 04/27/20  Contraception  undecided Pap: Under 21  Circumcision    Pediatrician    Support Person Mother and sister, (FOB deceased)         Previous Version       Review of Systems: 10 point review of systems negative unless otherwise noted in HPI  Past Medical History: Patient Active Problem List   Diagnosis Date Noted  . Supervision of normal first pregnancy, antepartum 09/29/2019     Clinic  [facility] Prenatal Labs  Dating LMP Blood type: B/Positive/-- (06/29 0940)   Genetic Screen SMA neg, Fragile-X neg, CF neg  NIPS:MaternTnegative neg Antibody:Negative (06/29 0940)  Anatomic [place] Complete Rubella: 4.77 (06/29 0940)  GTT Early: NA                Third trimester: 61  RPR: Non Reactive (06/29 0940)   Flu vaccine declines HBsAg: Negative (06/29 0940)   TDaP vaccine   03/08/2020                   Rhogam:n/a HIV: Non Reactive (06/29 0940)   Baby Food       Breast                                        GBS positive-PCN allergy-Clindamycin susceptible 04/27/20  Contraception  undecided Pap: Under 21  Circumcision    Pediatrician    Support Person Mother and sister, (FOB deceased)        Past Surgical History: Past Surgical History:  Procedure Laterality Date  . NO PAST SURGERIES      Past Obstetric History: # 1 - Date: None, Sex: None, Weight: None, GA: None, Delivery: None, Apgar1: None, Apgar5: None, Living: None, Birth Comments: None   Past Gynecologic History:  Family  History: No family history on file.  Social History: Social History   Socioeconomic History  . Marital status: Single    Spouse name: Not on file  . Number of children: Not on file  . Years of education: Not on file  . Highest education level: Not on file  Occupational History  . Not on file  Tobacco Use  . Smoking status: Never Smoker  . Smokeless tobacco: Never Used  Vaping Use  . Vaping Use: Never used  Substance and Sexual Activity  . Alcohol use: No  . Drug use: Not Currently  . Sexual activity: Yes    Birth control/protection: None  Other Topics Concern  . Not on file  Social History Narrative  . Not on file   Social Determinants of Health   Financial Resource Strain: Not on file  Food Insecurity: Not on file  Transportation Needs: Not on file  Physical Activity: Not on file  Stress: Not on file  Social Connections: Not on file  Intimate Partner Violence: Not on file    Medications: Prior to Admission medications   Not on File    Allergies: Allergies  Allergen Reactions  . Penicillins     Physical Exam: Vitals: Blood pressure 112/68, weight 215 lb (97.5 kg), last menstrual period 07/18/2019.  FHT:  145, moderate, +accels, no decels Toco: irregular  General: NAD HEENT: normocephalic, anicteric Pulmonary: No increased work of breathing Cardiovascular: RRR, distal pulses 2+ Abdomen: Gravid, non-tender Leopolds:vertex Genitourinary: 1.5 / 70 / -3 Extremities: no edema, erythema, or tenderness Neurologic: Grossly intact Psychiatric: mood appropriate, affect full  Labs: Results for orders placed or performed in visit on 05/03/20 (from the past 24 hour(s))  POC Urinalysis Dipstick OB     Status: Normal   Collection Time: 05/03/20  2:00 PM  Result Value Ref Range   Color, UA     Clarity, UA     Glucose, UA Negative Negative   Bilirubin, UA     Ketones, UA     Spec Grav, UA     Blood, UA     pH, UA     POC,PROTEIN,UA Negative Negative, Trace, Small (1+), Moderate (2+), Large (3+), 4+   Urobilinogen, UA     Nitrite, UA     Leukocytes, UA     Appearance     Odor      Assessment: 20 y.o. G1P0 [redacted]w[redacted]d by 05/03/2020, by Ultrasound presenting for routine obstetric visit  Plan: 1) IOL at [redacted]w[redacted]d on 05/07/2020  2) Fetus - +FHT  3) PNL - Blood type B/Positive/-- (06/29 0940) / Anti-bodyscreen Negative (06/29 0940) / Rubella 4.77 (06/29 0940) / Varicella Immune / RPR Non Reactive (10/26 1130) / HBsAg Negative (06/29 0940) / HIV Non Reactive (10/26 1130) / 1-hr OGTT 61 / GBS Positive/-- (01/11 1547)  4) Immunization History -  Immunization History  Administered Date(s) Administered  . Tdap 03/08/2020    5) Disposition - pending delivery  Vena Austria, MD, [facility], [facility] 05/03/2020, 2:03 PM

## 2020-05-04 ENCOUNTER — Other Ambulatory Visit
Admission: RE | Admit: 2020-05-04 | Discharge: 2020-05-04 | Disposition: A | Discharge status: Home / Self Care | Payer: Medicaid Other | Source: Ambulatory Visit | Attending: Obstetrics and Gynecology | Admitting: Obstetrics and Gynecology

## 2020-05-04 DIAGNOSIS — Z20822 Contact with and (suspected) exposure to covid-19: Secondary | ICD-10-CM | POA: Insufficient documentation

## 2020-05-04 DIAGNOSIS — Z01812 Encounter for preprocedural laboratory examination: Secondary | ICD-10-CM | POA: Insufficient documentation

## 2020-05-04 LAB — SARS CORONAVIRUS 2 (TAT 6-24 HRS): SARS Coronavirus 2: NEGATIVE

## 2020-05-05 ENCOUNTER — Telehealth: Payer: Self-pay

## 2020-05-05 LAB — STREP GP B SUSCEPTIBILITY

## 2020-05-05 LAB — SPECIMEN STATUS REPORT

## 2020-05-05 NOTE — Telephone Encounter (Signed)
Pt calling; has a lot of irritation in vaginal area; burning,itching; to the point she doesn't want to wear panties; seen Tues; cx sweep done.  [phone]  Pt states she has taken a bath and that helped.  Adv warm or cold compresses and monistat.

## 2020-05-07 ENCOUNTER — Inpatient Hospital Stay: Payer: Medicaid Other | Admitting: Anesthesiology

## 2020-05-07 ENCOUNTER — Other Ambulatory Visit: Payer: Self-pay | Admitting: Obstetrics & Gynecology

## 2020-05-07 ENCOUNTER — Inpatient Hospital Stay
Admission: RE | Admit: 2020-05-07 | Discharge: 2020-05-09 | DRG: 807 | Disposition: A | Discharge status: Home / Self Care | Payer: Medicaid Other | Attending: Obstetrics & Gynecology | Admitting: Obstetrics & Gynecology

## 2020-05-07 ENCOUNTER — Encounter: Payer: Self-pay | Admitting: Obstetrics and Gynecology

## 2020-05-07 ENCOUNTER — Other Ambulatory Visit: Payer: Self-pay

## 2020-05-07 DIAGNOSIS — Z20822 Contact with and (suspected) exposure to covid-19: Secondary | ICD-10-CM | POA: Diagnosis present

## 2020-05-07 DIAGNOSIS — O99824 Streptococcus B carrier state complicating childbirth: Secondary | ICD-10-CM | POA: Diagnosis present

## 2020-05-07 DIAGNOSIS — Z3400 Encounter for supervision of normal first pregnancy, unspecified trimester: Secondary | ICD-10-CM

## 2020-05-07 DIAGNOSIS — Z3490 Encounter for supervision of normal pregnancy, unspecified, unspecified trimester: Secondary | ICD-10-CM

## 2020-05-07 DIAGNOSIS — Z3A40 40 weeks gestation of pregnancy: Secondary | ICD-10-CM

## 2020-05-07 DIAGNOSIS — Z113 Encounter for screening for infections with a predominantly sexual mode of transmission: Secondary | ICD-10-CM | POA: Diagnosis present

## 2020-05-07 DIAGNOSIS — O480 Post-term pregnancy: Secondary | ICD-10-CM | POA: Diagnosis not present

## 2020-05-07 LAB — CBC
HCT: 35 % — ABNORMAL LOW (ref 36.0–46.0)
Hemoglobin: 12.3 g/dL (ref 12.0–15.0)
MCH: 29.7 pg (ref 26.0–34.0)
MCHC: 35.1 g/dL (ref 30.0–36.0)
MCV: 84.5 fL (ref 80.0–100.0)
Platelets: 265 10*3/uL (ref 150–400)
RBC: 4.14 MIL/uL (ref 3.87–5.11)
RDW: 13.2 % (ref 11.5–15.5)
WBC: 6.6 10*3/uL (ref 4.0–10.5)
nRBC: 0 % (ref 0.0–0.2)

## 2020-05-07 LAB — TYPE AND SCREEN
ABO/RH(D): B POS
Antibody Screen: NEGATIVE

## 2020-05-07 LAB — ABO/RH: ABO/RH(D): B POS

## 2020-05-07 MED ORDER — ACETAMINOPHEN 325 MG PO TABS: 650.0000 mg | ORAL_TABLET | ORAL | Status: DC | PRN

## 2020-05-07 MED ORDER — OXYTOCIN-SODIUM CHLORIDE 30-0.9 UT/500ML-% IV SOLN
INTRAVENOUS | Status: AC
  Filled 2020-05-07: qty 1000

## 2020-05-07 MED ORDER — LACTATED RINGERS IV SOLN: 500.0000 mL | INTRAVENOUS | Status: DC | PRN

## 2020-05-07 MED ORDER — EPHEDRINE 5 MG/ML INJ: 10.0000 mg | INTRAVENOUS | Status: DC | PRN

## 2020-05-07 MED ORDER — SOD CITRATE-CITRIC ACID 500-334 MG/5ML PO SOLN: 30.0000 mL | ORAL | Status: DC | PRN

## 2020-05-07 MED ORDER — ONDANSETRON HCL 4 MG/2ML IJ SOLN: 4.0000 mg | Freq: Four times a day (QID) | INTRAMUSCULAR | Status: DC | PRN

## 2020-05-07 MED ORDER — LACTATED RINGERS IV SOLN: 500.0000 mL | Freq: Once | INTRAVENOUS | Status: DC

## 2020-05-07 MED ORDER — LIDOCAINE HCL (PF) 1 % IJ SOLN: 30.0000 mL | INTRAMUSCULAR | Status: DC | PRN

## 2020-05-07 MED ORDER — AMMONIA AROMATIC IN INHA
RESPIRATORY_TRACT | Status: AC
  Filled 2020-05-07: qty 10

## 2020-05-07 MED ORDER — LACTATED RINGERS IV SOLN: INTRAVENOUS | Status: DC

## 2020-05-07 MED ORDER — MISOPROSTOL 200 MCG PO TABS
ORAL_TABLET | ORAL | Status: AC
  Filled 2020-05-07: qty 4

## 2020-05-07 MED ORDER — LIDOCAINE HCL (PF) 1 % IJ SOLN
INTRAMUSCULAR | Status: AC
  Filled 2020-05-07: qty 30

## 2020-05-07 MED ORDER — OXYTOCIN 10 UNIT/ML IJ SOLN
INTRAMUSCULAR | Status: AC
  Filled 2020-05-07: qty 2

## 2020-05-07 MED ORDER — FENTANYL 2.5 MCG/ML W/ROPIVACAINE 0.15% IN NS 100 ML EPIDURAL (ARMC)
12.0000 mL/h | EPIDURAL | Status: DC
  Administered 2020-05-07: 12 mL/h via EPIDURAL
  Filled 2020-05-07: qty 100

## 2020-05-07 MED ORDER — MISOPROSTOL 25 MCG QUARTER TABLET
25.0000 ug | ORAL_TABLET | ORAL | Status: DC | PRN
  Filled 2020-05-07: qty 1

## 2020-05-07 MED ORDER — OXYTOCIN BOLUS FROM INFUSION: 333.0000 mL | Freq: Once | INTRAVENOUS | Status: DC

## 2020-05-07 MED ORDER — OXYTOCIN-SODIUM CHLORIDE 30-0.9 UT/500ML-% IV SOLN
1.0000 m[IU]/min | INTRAVENOUS | Status: DC
  Administered 2020-05-07: 2 m[IU]/min via INTRAVENOUS
  Administered 2020-05-07: 20 m[IU]/min via INTRAVENOUS
  Administered 2020-05-07: 1 m[IU]/min via INTRAVENOUS
  Administered 2020-05-07: 22 m[IU]/min via INTRAVENOUS

## 2020-05-07 MED ORDER — BUTORPHANOL TARTRATE 1 MG/ML IJ SOLN
1.0000 mg | INTRAMUSCULAR | Status: DC | PRN
  Administered 2020-05-07: 1 mg via INTRAVENOUS
  Filled 2020-05-07: qty 1

## 2020-05-07 MED ORDER — OXYTOCIN-SODIUM CHLORIDE 30-0.9 UT/500ML-% IV SOLN
2.5000 [IU]/h | INTRAVENOUS | Status: DC
  Administered 2020-05-07: 2.5 [IU]/h via INTRAVENOUS

## 2020-05-07 MED ORDER — LIDOCAINE-EPINEPHRINE (PF) 1.5 %-1:200000 IJ SOLN
INTRAMUSCULAR | Status: DC | PRN
  Administered 2020-05-07: 3 mL via EPIDURAL

## 2020-05-07 MED ORDER — LIDOCAINE HCL (PF) 1 % IJ SOLN
INTRAMUSCULAR | Status: DC | PRN
  Administered 2020-05-07: 3 mL via SUBCUTANEOUS

## 2020-05-07 MED ORDER — SODIUM CHLORIDE 0.9 % IV SOLN
INTRAVENOUS | Status: DC | PRN
  Administered 2020-05-07: 10 mL via EPIDURAL

## 2020-05-07 MED ORDER — PHENYLEPHRINE 40 MCG/ML (10ML) SYRINGE FOR IV PUSH (FOR BLOOD PRESSURE SUPPORT): 80.0000 ug | PREFILLED_SYRINGE | INTRAVENOUS | Status: DC | PRN

## 2020-05-07 MED ORDER — DIPHENHYDRAMINE HCL 50 MG/ML IJ SOLN: 12.5000 mg | INTRAMUSCULAR | Status: DC | PRN

## 2020-05-07 MED ORDER — VANCOMYCIN HCL IN DEXTROSE 1-5 GM/200ML-% IV SOLN
1000.0000 mg | Freq: Two times a day (BID) | INTRAVENOUS | Status: DC
  Administered 2020-05-07: 1000 mg via INTRAVENOUS
  Filled 2020-05-07 (×3): qty 200

## 2020-05-07 MED ORDER — TERBUTALINE SULFATE 1 MG/ML IJ SOLN: 0.2500 mg | Freq: Once | INTRAMUSCULAR | Status: DC | PRN

## 2020-05-07 NOTE — Anesthesia Preprocedure Evaluation (Signed)
Anesthesia Evaluation  Patient identified by MRN, date of birth, ID band Patient awake    Reviewed: Allergy & Precautions, NPO status , Patient's Chart, lab work & pertinent test results  History of Anesthesia Complications Negative for: history of anesthetic complications  Airway Mallampati: III  TM Distance: >3 FB Neck ROM: Full    Dental no notable dental hx. (+) Teeth Intact   Pulmonary neg pulmonary ROS, neg sleep apnea, neg COPD, Patient abstained from smoking.Not current smoker,    Pulmonary exam normal breath sounds clear to auscultation       Cardiovascular Exercise Tolerance: Good METS(-) hypertension(-) CAD and (-) Past MI negative cardio ROS  (-) dysrhythmias  Rhythm:Regular Rate:Normal - Systolic murmurs    Neuro/Psych negative neurological ROS  negative psych ROS   GI/Hepatic neg GERD  ,(+)     (-) substance abuse  ,   Endo/Other  neg diabetes  Renal/GU negative Renal ROS     Musculoskeletal   Abdominal   Peds  Hematology   Anesthesia Other Findings History reviewed. No pertinent past medical history.  Reproductive/Obstetrics (+) Pregnancy                             Anesthesia Physical Anesthesia Plan  ASA: II  Anesthesia Plan: Epidural   Post-op Pain Management:    Induction:   PONV Risk Score and Plan: 2 and Treatment may vary due to age or medical condition and Ondansetron  Airway Management Planned: Natural Airway  Additional Equipment:   Intra-op Plan:   Post-operative Plan:   Informed Consent: I have reviewed the patients History and Physical, chart, labs and discussed the procedure including the risks, benefits and alternatives for the proposed anesthesia with the patient or authorized representative who has indicated his/her understanding and acceptance.       Plan Discussed with: Surgeon  Anesthesia Plan Comments: (Discussed R/B/A of  neuraxial anesthesia technique with patient: - rare risks of spinal/epidural hematoma, nerve damage, infection - Risk of PDPH - Risk of itching - Risk of nausea and vomiting - Risk of poor block necessitating replacement of epidural. Patient voiced understanding.)        Anesthesia Quick Evaluation

## 2020-05-07 NOTE — Anesthesia Procedure Notes (Signed)
Epidural Patient location during procedure: OB Start time: 05/07/2020 7:02 PM End time: 05/07/2020 7:18 PM  Staffing Anesthesiologist: Corinda Gubler, MD Performed: anesthesiologist   Preanesthetic Checklist Completed: patient identified, IV checked, site marked, risks and benefits discussed, surgical consent, monitors and equipment checked, pre-op evaluation and timeout performed  Epidural Patient position: sitting Prep: ChloraPrep Patient monitoring: heart rate, continuous pulse ox and blood pressure Approach: midline Location: L3-L4 Injection technique: LOR saline  Needle:  Needle type: Tuohy  Needle gauge: 17 G Needle length: 9 cm and 9 Needle insertion depth: 7 cm Catheter type: closed end flexible Catheter size: 19 Gauge Catheter at skin depth: 12 cm Test dose: negative and 1.5% lidocaine with Epi 1:200 K  Assessment Sensory level: T10 Events: blood not aspirated, injection not painful, no injection resistance, no paresthesia and negative IV test  Additional Notes first attempt Pt. Evaluated and documentation done after procedure finished. Patient identified. Risks/Benefits/Options discussed with patient including but not limited to bleeding, infection, nerve damage, paralysis, failed block, incomplete pain control, headache, blood pressure changes, nausea, vomiting, reactions to medication both or allergic, itching and postpartum back pain. Confirmed with bedside nurse the patient's most recent platelet count. Confirmed with patient that they are not currently taking any anticoagulation, have any bleeding history or any family history of bleeding disorders. Patient expressed understanding and wished to proceed. All questions were answered. Sterile technique was used throughout the entire procedure. Please see nursing notes for vital signs. Test dose was given through epidural catheter and negative prior to continuing to dose epidural or start infusion. Warning signs of high block  given to the patient including shortness of breath, tingling/numbness in hands, complete motor block, or any concerning symptoms with instructions to call for help. Patient was given instructions on fall risk and not to get out of bed. All questions and concerns addressed with instructions to call with any issues or inadequate analgesia.   Patient tolerated the insertion well without immediate complications.Reason for block:procedure for pain

## 2020-05-07 NOTE — Discharge Summary (Signed)
Postpartum Discharge Summary   Patient Name: Gwendolyn Garrett DOB: Aug 18, 2000 MRN: [id number]  Date of admission: 05/07/2020 Delivery date:05/07/2020  Delivering provider: Darlen Round, MD, [facility] Date of discharge: 05/09/2020  Admitting diagnosis: Encounter for elective induction of labor [Z34.90] Intrauterine pregnancy: [redacted]w[redacted]d    Secondary diagnosis:  Principal Problem:   Post-dates pregnancy Active Problems:   Encounter for elective induction of labor  Additional problems: none    Discharge diagnosis: Term Pregnancy Delivered                                              Post partum procedures:none Augmentation: AROM and Pitocin Complications: None  Hospital course: Induction of Labor With Vaginal Delivery   20y.o. yo G1P0 at 45w4das admitted to the hospital 05/07/2020 for induction of labor.  Indication for induction: Postdates.  Patient had an uncomplicated labor course as follows: Membrane Rupture Time/Date: 3:41 PM ,05/07/2020   Delivery Method:Vaginal, Spontaneous  Episiotomy: None  Lacerations:  1st degree  Details of delivery can be found in separate delivery note.  Patient had a routine postpartum course. Patient is discharged home 05/09/20.  Newborn Data: Birth date:05/07/2020  Birth time:10:52 PM  Gender:Female  Living status:Living  Apgars:8 ,9  Weight:3630 g   Magnesium Sulfate received: No BMZ received: No Rhophylac:No MMR:No T-DaP:Given prenatally Flu: No Transfusion:No  Physical exam  Vitals:   05/08/20 1055 05/08/20 1540 05/08/20 2301 05/09/20 0839  BP: 123/81 123/83 121/82 117/86  Pulse: 85 84 73 74  Resp: '18 18 18 20  ' Temp: 98.1 F (36.7 C) 98.4 F (36.9 C) 98.3 F (36.8 C) 98.2 F (36.8 C)  TempSrc: Oral Oral Oral Oral  SpO2: 100% 100% 100% 100%  Weight:      Height:       General: alert, cooperative and no distress Lochia: appropriate Uterine Fundus: firm Incision: N/A DVT Evaluation: No evidence of DVT seen  on physical exam. No cords or calf tenderness. No significant calf/ankle edema. Labs: Lab Results  Component Value Date   WBC 10.1 05/08/2020   HGB 10.4 (L) 05/08/2020   HCT 30.2 (L) 05/08/2020   MCV 85.1 05/08/2020   PLT 205 05/08/2020   CMP Latest Ref Rng & Units 04/11/2018  Glucose 70 - 99 mg/dL 113(H)  BUN 6 - 20 mg/dL 6  Creatinine 0.44 - 1.00 mg/dL 0.90  Sodium 135 - 145 mmol/L 132(L)  Potassium 3.5 - 5.1 mmol/L 3.0(L)  Chloride 98 - 111 mmol/L 101  CO2 22 - 32 mmol/L 22  Calcium 8.9 - 10.3 mg/dL 9.1  Total Protein 6.5 - 8.1 g/dL -  Total Bilirubin 0.3 - 1.2 mg/dL -  Alkaline Phos 50 - 162 U/L -  AST 15 - 41 U/L -  ALT 14 - 54 U/L -   Edinburgh Score: Edinburgh Postnatal Depression Scale Screening Tool 05/08/2020  I have been able to laugh and see the funny side of things. 0  I have looked forward with enjoyment to things. 0  I have blamed myself unnecessarily when things went wrong. 0  I have been anxious or worried for no good reason. 0  I have felt scared or panicky for no good reason. 0  Things have been getting on top of me. 0  I have been so unhappy that I have had  difficulty sleeping. 0  I have felt sad or miserable. 0  I have been so unhappy that I have been crying. 0  The thought of harming myself has occurred to me. 0  Edinburgh Postnatal Depression Scale Total 0      After visit meds:  Allergies as of 05/09/2020      Reactions   Penicillins Hives      Medication List    TAKE these medications   acetaminophen 500 MG tablet Commonly known as: TYLENOL Take 2 tablets (1,000 mg total) by mouth every 8 (eight) hours as needed for mild pain.   ibuprofen 600 MG tablet Commonly known as: ADVIL Take 1 tablet (600 mg total) by mouth every 6 (six) hours.            Discharge Care Instructions  (From admission, onward)         Start     Ordered   05/09/20 0000  Discharge wound care:       Comments: Perform wound care instructions   05/09/20  0851           Discharge home in stable condition Infant Feeding: Breast Infant Disposition:home with mother Discharge instruction: per After Visit Summary and Postpartum booklet. Activity: Advance as tolerated. Pelvic rest for 6 weeks.  Diet: routine diet Anticipated Birth Control: undecided Postpartum Appointment:6 weeks Future Appointments:No future appointments. Follow up Visit:  Follow-up Information    Gae Dry, MD. Schedule an appointment as soon as possible for a visit in 6 week(s).   Specialty: Obstetrics and Gynecology Contact information: [place] [address] [phone]               Prentice Docker, MD, [place] 05/09/2020 8:53 AM

## 2020-05-07 NOTE — H&P (Signed)
History and Physical Interval Note:  05/07/2020 8:55 AM  Gwendolyn Garrett  has presented today for INDUCTION OF LABOR (pitocin),  with the diagnosis of Postdates. The various methods of treatment have been discussed with the patient and family. After consideration of risks, benefits and other options for treatment, the patient has consented to  Labor induction .  The patient's history has been reviewed, patient examined, no change in status, and is stable for induction as planned.  See H&P. I have reviewed the patient's chart and labs.  Questions were answered to the patient's satisfaction.    Treat for GBS positivity w Vancomycin due to allergy. Plans to breast feed Discussed pros and cons of Pitocin Support person (mother) in room w patient Covid neg  Annamarie Major, MD, [facility], [facility] 05/07/2020  8:55 AM

## 2020-05-07 NOTE — Progress Notes (Signed)
  Labor Progress Note   20 y.o. G1P0 @ [redacted]w[redacted]d , admitted for  Pregnancy, Labor Management.   Subjective:  No pain Pitocin 20 mU/min for IOL  Objective:  BP 112/65 (BP Location: Right Arm)   Pulse 86   Temp 98.4 F (36.9 C) (Oral)   Resp 16   Ht 5\' 10"  (1.778 m)   Wt 96.2 kg   LMP 07/18/2019 (Exact Date)   BMI 30.42 kg/m  Abd: gravid, ND, FHT present, mild tenderness on exam Extr: trace to 1+ bilateral pedal edema SVE: CERVIX: 3-4 cm dilated, 80 effaced, -3 station  EFM: FHR: 140 bpm, variability: moderate,  accelerations:  Present,  decelerations:  Absent Toco: Frequency: Every 2-4 minutes Labs: I have reviewed the patient's lab results.   Assessment & Plan:  G1P0 @ [redacted]w[redacted]d, admitted for  Pregnancy and Labor/Delivery Management  1. Pain management: none. 2. FWB: FHT category 1.  3. ID: GBS positive 4. Labor management: Vancomycin prophylaxis AROM clear now Cont Pitocin  All discussed with patient, see orders  [redacted]w[redacted]d, MD, An[facility] Major [facility]/G[facility], [facility] 05/07/2020  3:45 PM

## 2020-05-07 NOTE — Discharge Instructions (Signed)

## 2020-05-08 ENCOUNTER — Encounter: Payer: Self-pay | Admitting: Obstetrics and Gynecology

## 2020-05-08 LAB — CBC
HCT: 30.2 % — ABNORMAL LOW (ref 36.0–46.0)
Hemoglobin: 10.4 g/dL — ABNORMAL LOW (ref 12.0–15.0)
MCH: 29.3 pg (ref 26.0–34.0)
MCHC: 34.4 g/dL (ref 30.0–36.0)
MCV: 85.1 fL (ref 80.0–100.0)
Platelets: 205 10*3/uL (ref 150–400)
RBC: 3.55 MIL/uL — ABNORMAL LOW (ref 3.87–5.11)
RDW: 13.3 % (ref 11.5–15.5)
WBC: 10.1 10*3/uL (ref 4.0–10.5)
nRBC: 0 % (ref 0.0–0.2)

## 2020-05-08 LAB — RPR: RPR Ser Ql: NONREACTIVE

## 2020-05-08 MED ORDER — DIPHENHYDRAMINE HCL 25 MG PO CAPS: 25.0000 mg | ORAL_CAPSULE | Freq: Four times a day (QID) | ORAL | Status: DC | PRN

## 2020-05-08 MED ORDER — ACETAMINOPHEN 325 MG PO TABS: 650.0000 mg | ORAL_TABLET | ORAL | Status: DC | PRN

## 2020-05-08 MED ORDER — OXYCODONE-ACETAMINOPHEN 5-325 MG PO TABS: 2.0000 | ORAL_TABLET | ORAL | Status: DC | PRN

## 2020-05-08 MED ORDER — ONDANSETRON HCL 4 MG PO TABS
4.0000 mg | ORAL_TABLET | ORAL | Status: DC | PRN
  Filled 2020-05-08: qty 1

## 2020-05-08 MED ORDER — SODIUM CHLORIDE 0.9% FLUSH: 3.0000 mL | INTRAVENOUS | Status: DC | PRN

## 2020-05-08 MED ORDER — IBUPROFEN 600 MG PO TABS
600.0000 mg | ORAL_TABLET | Freq: Four times a day (QID) | ORAL | Status: DC
  Administered 2020-05-08 – 2020-05-09 (×4): 600 mg via ORAL
  Filled 2020-05-08 (×4): qty 1

## 2020-05-08 MED ORDER — SIMETHICONE 80 MG PO CHEW: 80.0000 mg | CHEWABLE_TABLET | ORAL | Status: DC | PRN

## 2020-05-08 MED ORDER — SENNOSIDES-DOCUSATE SODIUM 8.6-50 MG PO TABS
2.0000 | ORAL_TABLET | ORAL | Status: DC
  Administered 2020-05-08 – 2020-05-09 (×2): 2 via ORAL
  Filled 2020-05-08 (×2): qty 2

## 2020-05-08 MED ORDER — BENZOCAINE-MENTHOL 20-0.5 % EX AERO
1.0000 "application " | INHALATION_SPRAY | CUTANEOUS | Status: DC | PRN
  Filled 2020-05-08: qty 56

## 2020-05-08 MED ORDER — SODIUM CHLORIDE 0.9 % IV SOLN: 250.0000 mL | INTRAVENOUS | Status: DC | PRN

## 2020-05-08 MED ORDER — COCONUT OIL OIL
1.0000 "application " | TOPICAL_OIL | Status: DC | PRN
  Filled 2020-05-08: qty 120

## 2020-05-08 MED ORDER — WITCH HAZEL-GLYCERIN EX PADS
1.0000 "application " | MEDICATED_PAD | CUTANEOUS | Status: DC | PRN
  Filled 2020-05-08: qty 100

## 2020-05-08 MED ORDER — ZOLPIDEM TARTRATE 5 MG PO TABS: 5.0000 mg | ORAL_TABLET | Freq: Every evening | ORAL | Status: DC | PRN

## 2020-05-08 MED ORDER — DIBUCAINE (PERIANAL) 1 % EX OINT
1.0000 "application " | TOPICAL_OINTMENT | CUTANEOUS | Status: DC | PRN
  Filled 2020-05-08: qty 28

## 2020-05-08 MED ORDER — SODIUM CHLORIDE 0.9% FLUSH: 3.0000 mL | Freq: Two times a day (BID) | INTRAVENOUS | Status: DC

## 2020-05-08 MED ORDER — ONDANSETRON HCL 4 MG/2ML IJ SOLN: 4.0000 mg | INTRAMUSCULAR | Status: DC | PRN

## 2020-05-08 MED ORDER — OXYCODONE-ACETAMINOPHEN 5-325 MG PO TABS: 1.0000 | ORAL_TABLET | ORAL | Status: DC | PRN

## 2020-05-08 NOTE — Progress Notes (Signed)
Post Partum Day 1 Subjective: no complaints, up ad lib, voiding and tolerating PO. She reports that breastfeeding is going well and the baby has been latching on easily.  Objective: Blood pressure 125/80, pulse 83, temperature 98.3 F (36.8 C), temperature source Oral, resp. rate 18, height 5\' 10"  (1.778 m), weight 96.2 kg, last menstrual period 07/18/2019, SpO2 100 %, unknown if currently breastfeeding.  Physical Exam:  General: alert, cooperative and no distress Lochia: appropriate Uterine Fundus: firm Incision: healing well DVT Evaluation: No evidence of DVT seen on physical exam. Negative Homan's sign.  Recent Labs    05/07/20 0911 05/08/20 0707  HGB 12.3 10.4*  HCT 35.0* 30.2*    Assessment/Plan: Plan for discharge tomorrow, Breastfeeding and Contraception undecided   LOS: 1 day   07/06/20 05/08/2020, 9:15 AM

## 2020-05-08 NOTE — Anesthesia Postprocedure Evaluation (Signed)
Anesthesia Post Note  Patient: Gwendolyn Garrett  Procedure(s) Performed: AN AD HOC LABOR EPIDURAL  Patient location during evaluation: Mother Baby Anesthesia Type: Epidural Level of consciousness: awake and alert Pain management: pain level controlled Vital Signs Assessment: post-procedure vital signs reviewed and stable Respiratory status: spontaneous breathing, nonlabored ventilation and respiratory function stable Cardiovascular status: stable Postop Assessment: no headache, no backache, patient able to bend at knees and able to ambulate Anesthetic complications: no   No complications documented.   Last Vitals:  Vitals:   05/08/20 0713 05/08/20 1055  BP: 125/80 123/81  Pulse: 83 85  Resp: 18 18  Temp: 36.8 C 36.7 C  SpO2: 100% 100%    Last Pain:  Vitals:   05/08/20 1055  TempSrc: Oral  PainSc:                  Cleda Mccreedy [name]

## 2020-05-08 NOTE — Lactation Note (Signed)
This note was copied from a baby's chart. Lactation Consultation Note  Patient Name: Gwendolyn Garrett [id number] Date: 05/08/2020 Reason for consult: Initial assessment;1st time breastfeeding;Term Age:21 hours   Initial lactation visit for G1P1 mom who had vaginal delivery 12 hours ago. RN's report independent with feedings, wet/stool diapers exceeding expectations, no discomfort noted by mom. Baby was active at the breast in biological nursing position, awake/alert, strong rhythmic sucking pattern with audible swallows, wide mouth and flanged top/bottom lips. LC praised mom for independence, pointed out how baby was at the breast, and encouraged this each time. Tips provided to keep baby awake/alert throughout entire feeding, encouraged 8 or more feeding attempts during first 24 hours, and reviewed hand expression between feeds or for when baby is sleepy.  Mom educated on newborn stomach size, feeding behaviors and patterns, early cues, benefits of skin to skin, signs of adequate intake, and output expectations. Mom and support person had no questions at this time. Encouraged to call out for support as needed.  Maternal Data Has patient been taught Hand Expression?: Yes Does the patient have breastfeeding experience prior to this delivery?: No  Feeding Mother's Current Feeding Choice: Breast Milk  LATCH Score Latch: Grasps breast easily, tongue down, lips flanged, rhythmical sucking.  Audible Swallowing: Spontaneous and intermittent  Type of Nipple: Everted at rest and after stimulation  Comfort (Breast/Nipple): Soft / non-tender  Hold (Positioning): No assistance needed to correctly position infant at breast.  LATCH Score: 10   Lactation Tools Discussed/Used Tools: Coconut oil;Comfort gels  Interventions Interventions: Breast feeding basics reviewed;Hand express  Discharge    Consult Status Consult Status: Follow-up Date: 05/08/20 Follow-up type:  In-patient    [name] 05/08/2020, 11:01 AM

## 2020-05-08 NOTE — Plan of Care (Signed)

## 2020-05-08 NOTE — Plan of Care (Signed)
  Problem: Education: Goal: Knowledge of condition will improve Outcome: Progressing   Problem: Activity: Goal: Will verbalize the importance of balancing activity with adequate rest periods Outcome: Progressing   Problem: Life Cycle: Goal: Chance of risk for complications during the postpartum period will decrease Outcome: Progressing

## 2020-05-08 NOTE — Progress Notes (Signed)
Pt. Transferred to Room 345 PP. Alert and oriented with pleasant affect. Color good, skin w&d. Lucile Shutters RN assuming care of Pt. And is completing assessment. Pt. Oriented to Room, POC and Fall Prevention. Pt. V/o.

## 2020-05-09 MED ORDER — ACETAMINOPHEN 500 MG PO TABS: 1000.0000 mg | ORAL_TABLET | Freq: Three times a day (TID) | ORAL | Status: DC | PRN

## 2020-05-09 MED ORDER — IBUPROFEN 600 MG PO TABS: 600.0000 mg | ORAL_TABLET | Freq: Four times a day (QID) | ORAL | 0 refills | Status: DC

## 2020-05-09 NOTE — Progress Notes (Signed)
Pt discharged with infant.  Discharge instructions, prescriptions and follow up appointment given to and reviewed with pt. Pt verbalized understanding. Escorted out by auxillary. 

## 2020-05-09 NOTE — Lactation Note (Signed)
This note was copied from a baby's chart. Lactation Consultation Note  Patient Name: Gwendolyn Garrett [id number] Date: 05/09/2020 Reason for consult: Follow-up assessment;Mother's request;1st time breastfeeding;Term Age:21 hours  Lactation follow-up before anticipated discharge and per mom's request. Overnight mom provided a pacifier after cluster feeding experience, unsure what else she should do. Baby, this morning, is having more difficulty latching. Mom continues to put baby in well supported position with good alignment and turned in towards breast, however baby leaves her mouth wide open and will not latch.  LC performed oral digit assessment, possible oral restriction but baby has strong rhythmic suck pattern, cups finger well.  LC provided size 29mm NS, and baby grasped and latched easily with automatic strong sucking pattern. Difficult latch may be the result of early pacifier introduction.  Mom educated thoroughly on the temporary use of nipple shield to transition baby back to the breast, attempted latching each feed without it, pumping post feeds when using the nipple shield. Encouraged to no longer use the pacifier, and provided education on how this may hinder breastfeeding, and encouraged the delay of other artifical nipples as she is establishing her milk supply. LC provided guidance for BF once home: what to expect the first 4-5 days, encouraged feeding with cues, reviewed cluster feeding, growth spurts, supply and demand, normal course of lactation, breast and nipple care, signs and symptoms of engorgement and mastitis and when to seek medical care. Information for outpatient lactation services given, as well as community breastfeeding support information. Encouraged to call out today as needed before going home.  Maternal Data Has patient been taught Hand Expression?: Yes Does the patient have breastfeeding experience prior to this delivery?: No  Feeding Mother's Current  Feeding Choice: Breast Milk  LATCH Score Latch: Grasps breast easily, tongue down, lips flanged, rhythmical sucking.  Audible Swallowing: Spontaneous and intermittent  Type of Nipple: Everted at rest and after stimulation  Comfort (Breast/Nipple): Soft / non-tender  Hold (Positioning): Assistance needed to correctly position infant at breast and maintain latch.  LATCH Score: 9   Lactation Tools Discussed/Used Tools: Nipple Shields Nipple shield size: 20  Interventions Interventions: Breast feeding basics reviewed;Assisted with latch;Hand express;Adjust position;Support pillows;Position options (nipple shield)  Discharge    Consult Status Consult Status: Follow-up Date: 05/09/20 Follow-up type: In-patient    [name] 05/09/2020, 10:41 AM

## 2020-06-20 ENCOUNTER — Ambulatory Visit (INDEPENDENT_AMBULATORY_CARE_PROVIDER_SITE_OTHER): Payer: BC Managed Care – PPO | Admitting: Obstetrics and Gynecology

## 2020-06-20 ENCOUNTER — Encounter: Payer: Self-pay | Admitting: Obstetrics and Gynecology

## 2020-06-20 ENCOUNTER — Other Ambulatory Visit: Payer: Self-pay

## 2020-06-20 DIAGNOSIS — Z30011 Encounter for initial prescription of contraceptive pills: Secondary | ICD-10-CM

## 2020-06-20 DIAGNOSIS — Z0000 Encounter for general adult medical examination without abnormal findings: Secondary | ICD-10-CM | POA: Insufficient documentation

## 2020-06-20 MED ORDER — CONCEPT DHA 53.5-38-1 MG PO CAPS: 1.0000 | ORAL_CAPSULE | Freq: Every day | ORAL | 3 refills | Status: DC

## 2020-06-20 MED ORDER — NORETHINDRONE 0.35 MG PO TABS: 1.0000 | ORAL_TABLET | Freq: Every day | ORAL | 3 refills | Status: DC

## 2020-06-20 NOTE — Progress Notes (Signed)
Postpartum Visit  Chief Complaint:  Chief Complaint  Patient presents with  . Post-op Follow-up    6 wk postpartum - discuss OCP options, RM 5    History of Present Illness: Patient is a 20 y.o. G1P1001 presents for postpartum visit.  Date of delivery: 05/07/2020 Type of delivery: Vaginal delivery - Vacuum or forceps assisted  no Episiotomy No.  Laceration: yes 1ST DEGREE Pregnancy or labor problems:  no Any problems since the delivery:  no  Newborn Details:  SINGLETON :  1. BabyGender female. Weight 3630g Maternal Details:  Breast or formula feeding: plans to breastfeed Intercourse: No  Contraception after delivery: No  Any bowel or bladder issues: No  Post partum depression/anxiety noted:  no Edinburgh Post-Partum Depression Score1 Date of last PAP: N/A under 21  Review of Systems: Review of Systems  Constitutional: Negative for chills, fever and malaise/fatigue.  Gastrointestinal: Negative for abdominal pain.  Genitourinary: Negative for dysuria, frequency and urgency.  Neurological: Negative for dizziness.  Psychiatric/Behavioral: Negative for depression.    The following portions of the patient's history were reviewed and updated as appropriate: allergies, current medications, past family history, past medical history, past social history, past surgical history and problem list.  Past Medical History:  No past medical history on file.  Past Surgical History:  Past Surgical History:  Procedure Laterality Date  . NO PAST SURGERIES      Family History:  No family history on file.  Social History:  Social History   Socioeconomic History  . Marital status: Single    Spouse name: Not on file  . Number of children: Not on file  . Years of education: Not on file  . Highest education level: Not on file  Occupational History  . Not on file  Tobacco Use  . Smoking status: Never Smoker  . Smokeless tobacco: Never Used  Vaping Use  . Vaping Use: Never  used  Substance and Sexual Activity  . Alcohol use: No  . Drug use: Not Currently  . Sexual activity: Yes    Birth control/protection: None  Other Topics Concern  . Not on file  Social History Narrative  . Not on file   Social Determinants of Health   Financial Resource Strain: Not on file  Food Insecurity: Not on file  Transportation Needs: Not on file  Physical Activity: Not on file  Stress: Not on file  Social Connections: Not on file  Intimate Partner Violence: Not on file    Allergies:  Allergies  Allergen Reactions  . Penicillins Hives    Medications: Prior to Admission medications   Medication Sig Start Date End Date Taking? Authorizing Provider  acetaminophen (TYLENOL) 500 MG tablet Take 2 tablets (1,000 mg total) by mouth every 8 (eight) hours as needed for mild pain. 05/09/20   Conard Novak, MD  ibuprofen (ADVIL) 600 MG tablet Take 1 tablet (600 mg total) by mouth every 6 (six) hours. 05/09/20   Conard Novak, MD    Physical Exam Blood pressure 100/62, height 5\' 10"  (1.778 m), weight 200 lb (90.7 kg), unknown if currently breastfeeding.    General: NAD HEENT: normocephalic, anicteric Pulmonary: No increased work of breathing Abdomen: NABS, soft, non-tender, non-distended.  Umbilicus without lesions.  No hepatomegaly, splenomegaly or masses palpable. No evidence of hernia. Genitourinary:  External: Normal external female genitalia.  Normal urethral meatus, normal  Bartholin's and Skene's glands.    Vagina: Normal vaginal mucosa, no evidence of prolapse.  Cervix: Grossly normal in appearance, no bleeding  Uterus: Non-enlarged, mobile, normal contour.  No CMT  Adnexa: ovaries non-enlarged, no adnexal masses  Rectal: deferred Extremities: no edema, erythema, or tenderness Neurologic: Grossly intact Psychiatric: mood appropriate, affect full   Edinburgh Postnatal Depression Scale - 06/20/20 1021      Edinburgh Postnatal Depression Scale:  In the  Past 7 Days   I have been able to laugh and see the funny side of things. 0    I have looked forward with enjoyment to things. 0    I have blamed myself unnecessarily when things went wrong. 1    I have been anxious or worried for no good reason. 0    I have felt scared or panicky for no good reason. 0    Things have been getting on top of me. 0    I have been so unhappy that I have had difficulty sleeping. 0    I have felt sad or miserable. 0    I have been so unhappy that I have been crying. 0    The thought of harming myself has occurred to me. 0    Edinburgh Postnatal Depression Scale Total 1           Assessment: 20 y.o. G1P1001 presenting for 6 week postpartum visit  Plan: Problem List Items Addressed This Visit   None      1) Contraception - Education given regarding options for contraception, as well as compatibility with breast feeding if applicable.  Patient plans on oral progesterone-only contraceptive for contraception.  2)  Pap - ASCCP guidelines and rational discussed. Start paps age 36  3) Patient underwent screening for postpartum depression with no signs of depression  4) No follow-ups on file.   Vena Austria, MD, [facility], [facility] 06/20/2020, 10:31 AM

## 2020-12-19 ENCOUNTER — Ambulatory Visit: Payer: BC Managed Care – PPO | Admitting: Obstetrics and Gynecology

## 2020-12-19 NOTE — Progress Notes (Deleted)
   PCP:  Patient, No Pcp Per (Inactive)   No chief complaint on file.    HPI:      Gwendolyn Garrett is a 20 y.o. G1P1001 whose LMP was No LMP recorded., presents today for her annual examination.  Her menses are {norm/abn:715}, lasting {number: 22536} days.  Dysmenorrhea {dysmen:716}. She {does:18564} have intermenstrual bleeding.  Sex activity: {sex active: 315163}.  Last Pap: {DXIP:382505397}  Results were: {norm/abn:16707::"no abnormalities"} /neg HPV DNA *** Hx of STDs: {STD hx:14358}  Last mammogram: {date:304500300}  Results were: {norm/abn:13465} There is no FH of breast cancer. There is no FH of ovarian cancer. The patient {does:18564} do self-breast exams.  Tobacco use: {tob:20664} Alcohol use: {Alcohol:11675} No drug use.  Exercise: {exercise:31265}  She {does:18564} get adequate calcium and Vitamin D in her diet.  No past medical history on file.  Past Surgical History:  Procedure Laterality Date   NO PAST SURGERIES      No family history on file.  Social History   Socioeconomic History   Marital status: Single    Spouse name: Not on file   Number of children: Not on file   Years of education: Not on file   Highest education level: Not on file  Occupational History   Not on file  Tobacco Use   Smoking status: Never   Smokeless tobacco: Never  Vaping Use   Vaping Use: Never used  Substance and Sexual Activity   Alcohol use: No   Drug use: Not Currently   Sexual activity: Yes    Birth control/protection: None  Other Topics Concern   Not on file  Social History Narrative   Not on file   Social Determinants of Health   Financial Resource Strain: Not on file  Food Insecurity: Not on file  Transportation Needs: Not on file  Physical Activity: Not on file  Stress: Not on file  Social Connections: Not on file  Intimate Partner Violence: Not on file     Current Outpatient Medications:    acetaminophen (TYLENOL) 500 MG tablet, Take 2  tablets (1,000 mg total) by mouth every 8 (eight) hours as needed for mild pain., Disp: , Rfl:    ibuprofen (ADVIL) 600 MG tablet, Take 1 tablet (600 mg total) by mouth every 6 (six) hours., Disp: 30 tablet, Rfl: 0   norethindrone (MICRONOR) 0.35 MG tablet, Take 1 tablet (0.35 mg total) by mouth daily., Disp: 90 tablet, Rfl: 3   Prenat-FeFum-FePo-FA-Omega 3 (CONCEPT DHA) 53.5-38-1 MG CAPS, Take 1 tablet by mouth daily., Disp: 90 capsule, Rfl: 3     ROS:  Review of Systems BREAST: No symptoms   Objective: There were no vitals taken for this visit.   OBGyn Exam  Results: No results found for this or any previous visit (from the past 24 hour(s)).  Assessment/Plan: No diagnosis found.  No orders of the defined types were placed in this encounter.            GYN counsel {counseling: 16159}     F/U  No follow-ups on file.  Natlie Asfour B. Valerye Kobus, PA-C 12/19/2020 11:51 AM

## 2021-03-02 ENCOUNTER — Ambulatory Visit: Payer: Medicaid Other

## 2021-09-11 ENCOUNTER — Ambulatory Visit (INDEPENDENT_AMBULATORY_CARE_PROVIDER_SITE_OTHER): Payer: BC Managed Care – PPO

## 2021-09-11 VITALS — Wt 232.0 lb

## 2021-09-11 DIAGNOSIS — Z3A00 Weeks of gestation of pregnancy not specified: Secondary | ICD-10-CM

## 2021-09-11 DIAGNOSIS — Z3480 Encounter for supervision of other normal pregnancy, unspecified trimester: Secondary | ICD-10-CM

## 2021-09-11 NOTE — Initial Assessments (Signed)
New OB Intake  I connected with  Gwendolyn Garrett on 09/11/21 at 11:15 AM EDT by telephone Video Visit and verified that I am speaking with the correct person using two identifiers. Nurse is located at [facility] and pt is located at works from home.  I explained I am completing New OB Intake today. We discussed her EDD of 04/14/2022 that is based on LMP of 07/08/2021. Pt is G2/P1001. I reviewed her allergies, medications, Medical/Surgical/OB history, and appropriate screenings. Based on history, this is a/an pregnancy uncomplicated .   Patient Active Problem List   Diagnosis Date Noted   Supervision of other normal pregnancy, antepartum 09/11/2021   Healthy adult on routine physical examination 06/20/2020    Concerns addressed today Pt had some dark blood in panties this am. Adv dark means it's old; denies IC in the last 24-48hrs; pt states none since.  Adv to monitor and if becomes like a period to be seen.  Delivery Plans:  Plans to deliver at [facility]  [place] Explained dating u/s is not needed d/t exact LMP.  Labs Discussed genetic screening with patient. Patient aware genetic testing to be drawn. Discussed possible labs to be drawn between now and NOB appt.  Tx'd to MH to schedule lab appt.  COVID Vaccine Patient has not had COVID vaccine.  WIC  Social Determinant Food Insecurity: Pt admits to food insecurity.  States her family helps her. Transportation: Patient denies transportation needs. Childcare: Discussed no children allowed at appointments.   First visit review I reviewed new OB appt with pt. I explained she will have ob bloodwork and pap smear/pelvic exam if indicated. Explained pt will be seen by Carie Caddy, CNM at first visit; encounter routed to appropriate provider.   Gwendolyn Garrett, [facility] 09/11/2021  11:38 AM  Clinical Staff Provider  Office Location  [facility] Dating    Language  English Anatomy US    Flu Vaccine  offer Genetic Screen   NIPS:   TDaP vaccine   offer Hgb A1C or  GTT Early : Third trimester :   Covid declines   LAB RESULTS   Rhogam   Blood Type     Feeding Plan breast Antibody    Contraception pill Rubella    Circumcision yes RPR     Pediatrician  [facility] HBsAg     Support Person undecided HIV    Prenatal Classes no Varicella     GBS  (For PCN allergy, check sensitivities)   BTL Consent  Hep C   VBAC Consent  Pap      Hgb Electro      CF      SMA

## 2021-09-15 ENCOUNTER — Other Ambulatory Visit: Payer: BC Managed Care – PPO

## 2021-09-15 ENCOUNTER — Telehealth: Payer: Self-pay | Admitting: Licensed Practical Nurse

## 2021-09-15 ENCOUNTER — Encounter: Payer: Self-pay | Admitting: Licensed Practical Nurse

## 2021-09-15 NOTE — Telephone Encounter (Signed)
Attempted to reach patient to reschedule lab appt that was scheduled for Friday, 6/16 at 8:20.  Left message for pt to call back to reschedule.

## 2021-09-18 NOTE — Telephone Encounter (Signed)
Was able to reach pt.  Lab appointment was rescheduled for 6/20 at 8:40/

## 2021-09-19 ENCOUNTER — Other Ambulatory Visit: Payer: BC Managed Care – PPO

## 2021-09-20 LAB — CBC/D/PLT+RPR+RH+ABO+RUBIGG...
Antibody Screen: NEGATIVE
Basophils Absolute: 0 10*3/uL (ref 0.0–0.2)
Basos: 0 %
EOS (ABSOLUTE): 0.1 10*3/uL (ref 0.0–0.4)
Eos: 2 %
HCV Ab: NONREACTIVE
HIV Screen 4th Generation wRfx: NONREACTIVE
Hematocrit: 38.7 % (ref 34.0–46.6)
Hemoglobin: 12.9 g/dL (ref 11.1–15.9)
Hepatitis B Surface Ag: NEGATIVE
Immature Grans (Abs): 0 10*3/uL (ref 0.0–0.1)
Immature Granulocytes: 0 %
Lymphocytes Absolute: 2.5 10*3/uL (ref 0.7–3.1)
Lymphs: 46 %
MCH: 29.2 pg (ref 26.6–33.0)
MCHC: 33.3 g/dL (ref 31.5–35.7)
MCV: 88 fL (ref 79–97)
Monocytes Absolute: 0.5 10*3/uL (ref 0.1–0.9)
Monocytes: 9 %
Neutrophils Absolute: 2.3 10*3/uL (ref 1.4–7.0)
Neutrophils: 43 %
Platelets: 266 10*3/uL (ref 150–450)
RBC: 4.42 x10E6/uL (ref 3.77–5.28)
RDW: 12.9 % (ref 11.7–15.4)
RPR Ser Ql: NONREACTIVE
Rh Factor: POSITIVE
Rubella Antibodies, IGG: 3.55 index (ref >0.99)
Varicella zoster IgG: 3243 index (ref >165)
WBC: 5.4 10*3/uL (ref 3.4–10.8)

## 2021-09-20 LAB — HCV INTERPRETATION

## 2021-09-26 ENCOUNTER — Telehealth: Payer: Self-pay | Admitting: Licensed Practical Nurse

## 2021-09-26 ENCOUNTER — Encounter: Payer: BC Managed Care – PPO | Admitting: Licensed Practical Nurse

## 2021-09-27 ENCOUNTER — Encounter: Payer: Self-pay | Admitting: Licensed Practical Nurse

## 2021-09-27 NOTE — Telephone Encounter (Signed)
Called pt to reschedule the 6/27 NOB appt with LMD.  Left message for  pt to call back to reschedule (2x).  Will send a MyChart letter as well.

## 2021-11-08 ENCOUNTER — Ambulatory Visit: Payer: BC Managed Care – PPO | Admitting: Obstetrics & Gynecology

## 2021-11-27 ENCOUNTER — Ambulatory Visit: Payer: BC Managed Care – PPO | Admitting: Obstetrics & Gynecology

## 2022-05-07 ENCOUNTER — Ambulatory Visit
Admission: EM | Admit: 2022-05-07 | Discharge: 2022-05-07 | Disposition: A | Discharge status: Home / Self Care | Payer: BC Managed Care – PPO | Attending: Emergency Medicine | Admitting: Emergency Medicine

## 2022-05-07 DIAGNOSIS — N390 Urinary tract infection, site not specified: Secondary | ICD-10-CM | POA: Insufficient documentation

## 2022-05-07 LAB — URINALYSIS, W/ REFLEX TO CULTURE (INFECTION SUSPECTED)
Bilirubin Urine: NEGATIVE
Glucose, UA: NEGATIVE mg/dL
Nitrite: NEGATIVE
Protein, ur: 30 mg/dL — AB
Specific Gravity, Urine: 1.025 (ref 1.005–1.030)
WBC, UA: >50 WBC/hpf (ref 0–5)
pH: 6.5 (ref 5.0–8.0)

## 2022-05-07 LAB — PREGNANCY, URINE: Preg Test, Ur: NEGATIVE

## 2022-05-07 MED ORDER — PHENAZOPYRIDINE HCL 200 MG PO TABS: 200.0000 mg | ORAL_TABLET | Freq: Three times a day (TID) | ORAL | 0 refills | Status: DC

## 2022-05-07 MED ORDER — NITROFURANTOIN MONOHYD MACRO 100 MG PO CAPS: 100.0000 mg | ORAL_CAPSULE | Freq: Two times a day (BID) | ORAL | 0 refills | Status: DC

## 2022-05-07 NOTE — ED Provider Notes (Signed)
[facility]    CSN: [id number] Arrival date & time: 05/07/22  1412      History   Chief Complaint Chief Complaint  Patient presents with   Dysuria    HPI Gwendolyn Garrett is a 22 y.o. female.   HPI  22 year old female here for evaluation of urinary complaints.  The patient reports that for the last 5 days she has been experiencing burning with urination along with urinary urgency and frequency.  She states that she had nausea and vomiting on the first night of symptoms and that is since resolved.  She does have some suprapubic pain.  Her last menstrual period was 1 month ago.  History reviewed. No pertinent past medical history.  Patient Active Problem List   Diagnosis Date Noted   Supervision of other normal pregnancy, antepartum 09/11/2021   Healthy adult on routine physical examination 06/20/2020    Past Surgical History:  Procedure Laterality Date   NO PAST SURGERIES     WISDOM TOOTH EXTRACTION     all four; age 67    OB History     Gravida  2   Para  1   Term  1   Preterm      AB      Living  1      SAB      IAB      Ectopic      Multiple  0   Live Births  1            Home Medications    Prior to Admission medications   Medication Sig Start Date End Date Taking? Authorizing Provider  nitrofurantoin, macrocrystal-monohydrate, (MACROBID) 100 MG capsule Take 1 capsule (100 mg total) by mouth 2 (two) times daily. 05/07/22  Yes Margarette Canada, NP  phenazopyridine (PYRIDIUM) 200 MG tablet Take 1 tablet (200 mg total) by mouth 3 (three) times daily. 05/07/22  Yes Margarette Canada, NP    Family History History reviewed. No pertinent family history.  Social History Social History   Tobacco Use   Smoking status: Never   Smokeless tobacco: Never  Vaping Use   Vaping Use: Never used  Substance Use Topics   Alcohol use: No   Drug use: Not Currently     Allergies   Penicillins   Review of Systems Review of Systems   Constitutional:  Negative for fever.  Gastrointestinal:  Positive for abdominal pain, nausea and vomiting.  Genitourinary:  Positive for dyspareunia, frequency, hematuria and urgency. Negative for vaginal bleeding.  Musculoskeletal:  Negative for back pain.     Physical Exam Triage Vital Signs ED Triage Vitals [05/07/22 1528]  Enc Vitals Group     BP      Pulse      Resp      Temp      Temp src      SpO2      Weight 214 lb (97.1 kg)     Height 6' (1.829 m)     Head Circumference      Peak Flow      Pain Score 0     Pain Loc      Pain Edu?      Excl. in [place]?    No data found.  Updated Vital Signs BP (!) 140/89 (BP Location: Left Arm)   Pulse 100   Temp 98.5 F (36.9 C) (Oral)   Resp 16   Ht 6' (1.829 m)   Wt 214 lb (97.1  kg)   LMP 04/06/2022 (Exact Date)   SpO2 97%   Breastfeeding No   BMI 29.02 kg/m   Visual Acuity Right Eye Distance:   Left Eye Distance:   Bilateral Distance:    Right Eye Near:   Left Eye Near:    Bilateral Near:     Physical Exam Vitals and nursing note reviewed.  Constitutional:      Appearance: Normal appearance. She is not ill-appearing.  HENT:     Head: Normocephalic and atraumatic.  Cardiovascular:     Rate and Rhythm: Normal rate.     Pulses: Normal pulses.     Heart sounds: Normal heart sounds. No murmur heard.    No friction rub. No gallop.  Pulmonary:     Effort: Pulmonary effort is normal.     Breath sounds: Normal breath sounds. No wheezing, rhonchi or rales.  Abdominal:     General: Abdomen is flat.     Palpations: Abdomen is soft.     Tenderness: There is abdominal tenderness. There is no right CVA tenderness, left CVA tenderness, guarding or rebound.     Comments: Suprapubic tenderness.  Skin:    General: Skin is warm and dry.     Capillary Refill: Capillary refill takes less than 2 seconds.  Neurological:     General: No focal deficit present.     Mental Status: She is alert and oriented to person, place,  and time.  Psychiatric:        Mood and Affect: Mood normal.        Behavior: Behavior normal.        Thought Content: Thought content normal.        Judgment: Judgment normal.      UC Treatments / Results  Labs (all labs ordered are listed, but only abnormal results are displayed) Labs Reviewed  URINALYSIS, W/ REFLEX TO CULTURE (INFECTION SUSPECTED) - Abnormal; Notable for the following components:      Result Value   APPearance CLOUDY (*)    Hgb urine dipstick MODERATE (*)    Ketones, ur TRACE (*)    Protein, ur 30 (*)    Leukocytes,Ua TRACE (*)    Bacteria, UA MANY (*)    All other components within normal limits  PREGNANCY, URINE    EKG   Radiology No results found.  Procedures Procedures (including critical care time)  Medications Ordered in UC Medications - No data to display  Initial Impression / Assessment and Plan / UC Course  I have reviewed the triage vital signs and the nursing notes.  Pertinent labs & imaging results that were available during my care of the patient were reviewed by me and considered in my medical decision making (see chart for details).   Patient is a pleasant, nontoxic-appearing 22 year old female here for evaluation of 5 days worth of pain with urination along with urinary urgency and frequency.  She also endorses hematuria.  She states that there is pain at end of urination and there is blood when she wipes.  It is not enough that she has to wear pads and she denies any vaginal bleeding.  Her last menstrual cycle was 1 month ago.  She is unsure if there is any chance that she could be pregnant but she is having unprotected sex.  Her physical exam reveals mild suprapubic tenderness without guarding or rebound but no CVA tenderness.  I will order a urinalysis and urine pregnancy test to look for the presence of infection  and to rule out pregnancy.  Urine pregnancy test is negative.  Urinalysis shows cloudy appearance with moderate  hemoglobin, trace ketones, 30 protein, trace leukocyte esterase and is negative for nitrites.  Reflex microscopy shows greater than 50 WBCs, many bacteria, and WBC clumps.  The urine will not reflex to culture due to too many squamous epithelials.  I will discharge patient home with diagnosis of urinary tract infection and start her on Macrobid 100 mg twice daily for 5 days.  I will also prescribe Pyridium to help with the urinary discomfort.  Return precautions reviewed.  Final Clinical Impressions(s) / UC Diagnoses   Final diagnoses:  Lower urinary tract infectious disease     Discharge Instructions      Take the Macrobid twice daily for 5 days with food for treatment of urinary tract infection.  Use the Pyridium every 8 hours as needed for urinary discomfort.  This will turn your urine a bright red-orange.  Increase your oral fluid intake so that you increase your urine production and or flushing your urinary system.  Take an over-the-counter probiotic, such as Culturelle-Align-Activia, 1 hour after each dose of antibiotic to prevent diarrhea or yeast infections from forming.  We will culture urine and change the antibiotics if necessary.  Return for reevaluation, or see your primary care provider, for any new or worsening symptoms.      ED Prescriptions     Medication Sig Dispense Auth. Provider   nitrofurantoin, macrocrystal-monohydrate, (MACROBID) 100 MG capsule Take 1 capsule (100 mg total) by mouth 2 (two) times daily. 10 capsule Margarette Canada, NP   phenazopyridine (PYRIDIUM) 200 MG tablet Take 1 tablet (200 mg total) by mouth 3 (three) times daily. 6 tablet Margarette Canada, NP      PDMP not reviewed this encounter.   Margarette Canada, NP 05/07/22 [id number]

## 2022-05-07 NOTE — Discharge Instructions (Addendum)

## 2022-05-07 NOTE — ED Triage Notes (Signed)
Pt c/o dysuria & hematuria x5 days, denies any frequency, fever or back pain.

## 2022-07-22 ENCOUNTER — Ambulatory Visit
Admission: EM | Admit: 2022-07-22 | Discharge: 2022-07-22 | Disposition: A | Discharge status: Home / Self Care | Payer: BC Managed Care – PPO | Attending: Family Medicine | Admitting: Family Medicine

## 2022-07-22 ENCOUNTER — Encounter: Payer: Self-pay | Admitting: Emergency Medicine

## 2022-07-22 DIAGNOSIS — L03317 Cellulitis of buttock: Secondary | ICD-10-CM | POA: Diagnosis not present

## 2022-07-22 MED ORDER — SULFAMETHOXAZOLE-TRIMETHOPRIM 800-160 MG PO TABS: 1.0000 | ORAL_TABLET | Freq: Two times a day (BID) | ORAL | 0 refills | Status: AC

## 2022-07-22 NOTE — Discharge Instructions (Signed)
Stop by the pharmacy to pick up your prescriptions.  Follow up with your primary care provider as needed.  

## 2022-07-22 NOTE — ED Provider Notes (Addendum)
[facility]    CSN: [id number] Arrival date & time: 07/22/22  1155      History   Chief Complaint Chief Complaint  Patient presents with   Abscess    HPI Gwendolyn Garrett is a 22 y.o. female.   HPI  Gwendolyn Garrett presents for right mid buttock bump that she thought was a mosquito bite.  Has painful sitting for the past 5 days.  She tried nothing for her symptoms prior to arrival.  She had pain with walking but localized to the area.  He has had no fever.    History reviewed. No pertinent past medical history.  Patient Active Problem List   Diagnosis Date Noted   Supervision of other normal pregnancy, antepartum 09/11/2021   Healthy adult on routine physical examination 06/20/2020    Past Surgical History:  Procedure Laterality Date   NO PAST SURGERIES     WISDOM TOOTH EXTRACTION     all four; age 21    OB History     Gravida  2   Para  1   Term  1   Preterm      AB      Living  1      SAB      IAB      Ectopic      Multiple  0   Live Births  1            Home Medications    Prior to Admission medications   Medication Sig Start Date End Date Taking? Authorizing Provider  sulfamethoxazole-trimethoprim (BACTRIM DS) 800-160 MG tablet Take 1 tablet by mouth 2 (two) times daily for 7 days. 07/22/22 07/29/22 Yes Katha Cabal, DO    Family History History reviewed. No pertinent family history.  Social History Social History   Tobacco Use   Smoking status: Never   Smokeless tobacco: Never  Vaping Use   Vaping Use: Never used  Substance Use Topics   Alcohol use: No   Drug use: Not Currently     Allergies   Penicillins   Review of Systems Review of Systems :negative unless otherwise stated in HPI.      Physical Exam Triage Vital Signs ED Triage Vitals  Enc Vitals Group     BP 07/22/22 1204 125/84     Pulse Rate 07/22/22 1204 93     Resp 07/22/22 1204 14     Temp 07/22/22 1204 98.2 F (36.8 C)     Temp  Source 07/22/22 1204 Oral     SpO2 07/22/22 1204 98 %     Weight 07/22/22 1202 200 lb (90.7 kg)     Height 07/22/22 1202 6' (1.829 m)     Head Circumference --      Peak Flow --      Pain Score 07/22/22 1201 5     Pain Loc --      Pain Edu? --      Excl. in GC? --    No data found.  Updated Vital Signs BP 125/84 (BP Location: Right Arm)   Pulse 93   Temp 98.2 F (36.8 C) (Oral)   Resp 14   Ht 6' (1.829 m)   Wt 90.7 kg   LMP 06/25/2022 (Approximate)   SpO2 98%   BMI 27.12 kg/m   Visual Acuity Right Eye Distance:   Left Eye Distance:   Bilateral Distance:    Right Eye Near:   Left Eye Near:    Bilateral  Near:     Physical Exam  GEN: alert, well appearing female, in no acute distress  EYES: extra occular movements intact, no scleral injection CV: regular rate  RESP: no increased work of breathing MSK: no extremity edema, no gross deformities NEURO: alert, moves all extremities appropriately, normal gait PSYCH: Normal affect, appropriate speech and behavior  SKIN: warm and dry; right buttock hyperpigmented papule with surrounding induration and larger area of erythema     UC Treatments / Results  Labs (all labs ordered are listed, but only abnormal results are displayed) Labs Reviewed - No data to display  EKG   Radiology No results found.  Procedures Procedures (including critical care time)  Medications Ordered in UC Medications - No data to display  Initial Impression / Assessment and Plan / UC Course  I have reviewed the triage vital signs and the nursing notes.  Pertinent labs & imaging results that were available during my care of the patient were reviewed by me and considered in my medical decision making (see chart for details).     Patient is a 22 y.o. femalewho presents for buttock wound.  Overall, patient is well-appearing and well-hydrated.  Vital signs stable.  Annelis is afebrile.  Exam concerning for cellulitis vs abscess.  Treat  with Bactrim as below.   Reviewed expectations regarding course of current medical issues.  All questions asked were answered.  Outlined signs and symptoms indicating need for more acute intervention. Patient verbalized understanding. After Visit Summary given.   Final Clinical Impressions(s) / UC Diagnoses   Final diagnoses:  Cellulitis of buttock     Discharge Instructions      Stop by the pharmacy to pick up your prescriptions.  Follow up with your primary care provider as needed.      ED Prescriptions     Medication Sig Dispense Auth. Provider   sulfamethoxazole-trimethoprim (BACTRIM DS) 800-160 MG tablet Take 1 tablet by mouth 2 (two) times daily for 7 days. 14 tablet Rochelle Larue, Seward Meth, DO      PDMP not reviewed this encounter.              Katha Cabal, DO 07/22/22 1229    Katha Cabal, DO 08/08/22 2049

## 2022-07-22 NOTE — ED Triage Notes (Signed)
Patient c/o tender abscess on her right buttock that started 2 days ago.  Patient denies fevers.

## 2022-08-17 ENCOUNTER — Encounter: Payer: Self-pay | Admitting: Emergency Medicine

## 2022-08-17 ENCOUNTER — Ambulatory Visit
Admission: EM | Admit: 2022-08-17 | Discharge: 2022-08-17 | Disposition: A | Discharge status: Home / Self Care | Payer: Medicaid Other | Attending: Physician Assistant | Admitting: Physician Assistant

## 2022-08-17 DIAGNOSIS — L0231 Cutaneous abscess of buttock: Secondary | ICD-10-CM

## 2022-08-17 MED ORDER — MUPIROCIN 2 % EX OINT: 1.0000 | TOPICAL_OINTMENT | Freq: Two times a day (BID) | CUTANEOUS | 0 refills | Status: DC

## 2022-08-17 MED ORDER — DOXYCYCLINE HYCLATE 100 MG PO CAPS: 100.0000 mg | ORAL_CAPSULE | Freq: Two times a day (BID) | ORAL | 0 refills | Status: AC

## 2022-08-17 NOTE — ED Provider Notes (Signed)
[facility]    CSN: [id number] Arrival date & time: 08/17/22  1656      History   Chief Complaint Chief Complaint  Patient presents with   Abscess    buttock    HPI Gwendolyn Garrett is a 22 y.o. female presenting for 3-day history of an area of swelling and redness of the right buttocks.  She reports that it started draining today while in the waiting room.  Has noticed a little bit of blood and pus.  Denies fever.  Reports similar symptoms last month affecting the same buttocks but in a different area.  She was treated with Bactrim for a week and reports symptoms got better.  HPI  History reviewed. No pertinent past medical history.  Patient Active Problem List   Diagnosis Date Noted   Supervision of other normal pregnancy, antepartum 09/11/2021   Healthy adult on routine physical examination 06/20/2020    Past Surgical History:  Procedure Laterality Date   NO PAST SURGERIES     WISDOM TOOTH EXTRACTION     all four; age 66    OB History     Gravida  2   Para  1   Term  1   Preterm      AB      Living  1      SAB      IAB      Ectopic      Multiple  0   Live Births  1            Home Medications    Prior to Admission medications   Medication Sig Start Date End Date Taking? Authorizing Provider  doxycycline (VIBRAMYCIN) 100 MG capsule Take 1 capsule (100 mg total) by mouth 2 (two) times daily for 10 days. 08/17/22 08/27/22 Yes Shirlee Latch, PA-C  mupirocin ointment (BACTROBAN) 2 % Apply 1 Application topically 2 (two) times daily. 08/17/22  Yes Shirlee Latch PA-C    Family History History reviewed. No pertinent family history.  Social History Social History   Tobacco Use   Smoking status: Never   Smokeless tobacco: Never  Vaping Use   Vaping Use: Never used  Substance Use Topics   Alcohol use: No   Drug use: Not Currently     Allergies   Penicillins   Review of Systems Review of Systems  Constitutional:   Negative for fatigue and fever.  Skin:  Positive for color change and wound.     Physical Exam Triage Vital Signs ED Triage Vitals  Enc Vitals Group     BP 08/17/22 1726 (!) 128/94     Pulse Rate 08/17/22 1726 95     Resp 08/17/22 1726 14     Temp 08/17/22 1726 98.9 F (37.2 C)     Temp Source 08/17/22 1726 Oral     SpO2 08/17/22 1726 98 %     Weight 08/17/22 1723 199 lb 15.3 oz (90.7 kg)     Height 08/17/22 1723 6' (1.829 m)     Head Circumference --      Peak Flow --      Pain Score 08/17/22 1723 10     Pain Loc --      Pain Edu? --      Excl. in GC? --    No data found.  Updated Vital Signs BP (!) 128/94 (BP Location: Right Arm)   Pulse 95   Temp 98.9 F (37.2 C) (Oral)  Resp 14   Ht 6' (1.829 m)   Wt 199 lb 15.3 oz (90.7 kg)   LMP 06/25/2022 (Approximate)   SpO2 98%   BMI 27.12 kg/m     Physical Exam Vitals and nursing note reviewed.  Constitutional:      General: She is not in acute distress.    Appearance: Normal appearance. She is not ill-appearing or toxic-appearing.  HENT:     Head: Normocephalic and atraumatic.  Eyes:     General: No scleral icterus.       Right eye: No discharge.        Left eye: No discharge.     Conjunctiva/sclera: Conjunctivae normal.  Cardiovascular:     Rate and Rhythm: Normal rate and regular rhythm.  Pulmonary:     Effort: Pulmonary effort is normal. No respiratory distress.  Musculoskeletal:     Cervical back: Neck supple.  Skin:    General: Skin is dry.     Findings: Erythema (3 cm x 3 cm area of erythema and induration w/o fluctuance of right buttocks. It is very TTP. Central open wound with pustular drainage) present.  Neurological:     General: No focal deficit present.     Mental Status: She is alert. Mental status is at baseline.     Motor: No weakness.     Gait: Gait normal.  Psychiatric:        Mood and Affect: Mood normal.        Behavior: Behavior normal.        Thought Content: Thought content  normal.      UC Treatments / Results  Labs (all labs ordered are listed, but only abnormal results are displayed) Labs Reviewed - No data to display  EKG   Radiology No results found.  Procedures Procedures (including critical care time)  Medications Ordered in UC Medications - No data to display  Initial Impression / Assessment and Plan / UC Course  I have reviewed the triage vital signs and the nursing notes.  Pertinent labs & imaging results that were available during my care of the patient were reviewed by me and considered in my medical decision making (see chart for details).   22 year old female presents for abscess to the right buttocks x 2 days.  Reports similar symptoms last month and was treated with Bactrim.  She did not report any history of MRSA.  Exam today is consistent with a mild abscess which is already open and draining.  Will treat with doxycycline x 10 days.  Also sent mupirocin ointment.  Discussed warm compresses.  Reviewed good hygiene.  Reviewed return precautions.   Final Clinical Impressions(s) / UC Diagnoses   Final diagnoses:  Abscess of buttock, right     Discharge Instructions      -Continue to apply warm compresses or sit in the hot bath frequently.  Clean well with soap and water. - Take Tylenol or ibuprofen for pain relief. - Take full course of antibiotics. - If symptoms are not improving over the next couple days or if they worsen, please return.    ED Prescriptions     Medication Sig Dispense Auth. Provider   doxycycline (VIBRAMYCIN) 100 MG capsule Take 1 capsule (100 mg total) by mouth 2 (two) times daily for 10 days. 20 capsule Eusebio Friendly B, PA-C   mupirocin ointment (BACTROBAN) 2 % Apply 1 Application topically 2 (two) times daily. 22 g Gareth Morgan      PDMP not  reviewed this encounter.   Shirlee Latch, PA-C 08/17/22 1857

## 2022-08-17 NOTE — ED Triage Notes (Signed)
Patient reports abscess on her right buttock for the past 3 days.  Patient reports that it started draining today.  Patient denies fevers.

## 2022-08-17 NOTE — Discharge Instructions (Addendum)
-  Continue to apply warm compresses or sit in the hot bath frequently.  Clean well with soap and water. - Take Tylenol or ibuprofen for pain relief. - Take full course of antibiotics. - If symptoms are not improving over the next couple days or if they worsen, please return.

## 2022-10-22 ENCOUNTER — Other Ambulatory Visit: Payer: Self-pay

## 2022-10-22 ENCOUNTER — Inpatient Hospital Stay
Admission: EM | Admit: 2022-10-22 | Discharge: 2022-10-25 | DRG: 872 | Disposition: A | Discharge status: Home / Self Care | Payer: Medicaid Other | Attending: Internal Medicine | Admitting: Internal Medicine

## 2022-10-22 ENCOUNTER — Emergency Department: Payer: Medicaid Other

## 2022-10-22 DIAGNOSIS — A4151 Sepsis due to Escherichia coli [E. coli]: Principal | ICD-10-CM | POA: Diagnosis present

## 2022-10-22 DIAGNOSIS — Z87440 Personal history of urinary (tract) infections: Secondary | ICD-10-CM

## 2022-10-22 DIAGNOSIS — Z20822 Contact with and (suspected) exposure to covid-19: Secondary | ICD-10-CM | POA: Diagnosis present

## 2022-10-22 DIAGNOSIS — E876 Hypokalemia: Secondary | ICD-10-CM | POA: Diagnosis present

## 2022-10-22 DIAGNOSIS — B9620 Unspecified Escherichia coli [E. coli] as the cause of diseases classified elsewhere: Secondary | ICD-10-CM | POA: Diagnosis present

## 2022-10-22 DIAGNOSIS — A419 Sepsis, unspecified organism: Secondary | ICD-10-CM | POA: Diagnosis present

## 2022-10-22 DIAGNOSIS — Z880 Allergy status to penicillin: Secondary | ICD-10-CM

## 2022-10-22 DIAGNOSIS — N390 Urinary tract infection, site not specified: Secondary | ICD-10-CM | POA: Diagnosis present

## 2022-10-22 DIAGNOSIS — N3001 Acute cystitis with hematuria: Secondary | ICD-10-CM

## 2022-10-22 LAB — URINALYSIS, ROUTINE W REFLEX MICROSCOPIC
Bacteria, UA: NONE SEEN
Bilirubin Urine: NEGATIVE
Glucose, UA: NEGATIVE mg/dL
Ketones, ur: NEGATIVE mg/dL
Nitrite: NEGATIVE
Protein, ur: 100 mg/dL — AB
RBC / HPF: >50 RBC/hpf (ref 0–5)
Specific Gravity, Urine: 1.011 (ref 1.005–1.030)
WBC, UA: >50 WBC/hpf (ref 0–5)
pH: 7 (ref 5.0–8.0)

## 2022-10-22 LAB — CBC WITH DIFFERENTIAL/PLATELET
Abs Immature Granulocytes: 0.03 10*3/uL (ref 0.00–0.07)
Basophils Absolute: 0 10*3/uL (ref 0.0–0.1)
Basophils Relative: 0 %
Eosinophils Absolute: 0 10*3/uL (ref 0.0–0.5)
Eosinophils Relative: 0 %
HCT: 39.1 % (ref 36.0–46.0)
Hemoglobin: 13.7 g/dL (ref 12.0–15.0)
Immature Granulocytes: 0 %
Lymphocytes Relative: 25 %
Lymphs Abs: 3.2 10*3/uL (ref 0.7–4.0)
MCH: 29.7 pg (ref 26.0–34.0)
MCHC: 35 g/dL (ref 30.0–36.0)
MCV: 84.6 fL (ref 80.0–100.0)
Monocytes Absolute: 1 10*3/uL (ref 0.1–1.0)
Monocytes Relative: 8 %
Neutro Abs: 8.2 10*3/uL — ABNORMAL HIGH (ref 1.7–7.7)
Neutrophils Relative %: 67 %
Platelets: 261 10*3/uL (ref 150–400)
RBC: 4.62 MIL/uL (ref 3.87–5.11)
RDW: 12.1 % (ref 11.5–15.5)
WBC: 12.5 10*3/uL — ABNORMAL HIGH (ref 4.0–10.5)
nRBC: 0 % (ref 0.0–0.2)

## 2022-10-22 LAB — COMPREHENSIVE METABOLIC PANEL
ALT: 30 U/L (ref 0–44)
AST: 40 U/L (ref 15–41)
Albumin: 4.4 g/dL (ref 3.5–5.0)
Alkaline Phosphatase: 51 U/L (ref 38–126)
Anion gap: 8 (ref 5–15)
BUN: 8 mg/dL (ref 6–20)
CO2: 23 mmol/L (ref 22–32)
Calcium: 9.1 mg/dL (ref 8.9–10.3)
Chloride: 103 mmol/L (ref 98–111)
Creatinine, Ser: 1 mg/dL (ref 0.44–1.00)
GFR, Estimated: >60 mL/min (ref >60)
Glucose, Bld: 103 mg/dL — ABNORMAL HIGH (ref 70–99)
Potassium: 3.2 mmol/L — ABNORMAL LOW (ref 3.5–5.1)
Sodium: 134 mmol/L — ABNORMAL LOW (ref 135–145)
Total Bilirubin: 1.4 mg/dL — ABNORMAL HIGH (ref 0.3–1.2)
Total Protein: 8.2 g/dL — ABNORMAL HIGH (ref 6.5–8.1)

## 2022-10-22 LAB — LACTIC ACID, PLASMA: Lactic Acid, Venous: 0.9 mmol/L (ref 0.5–1.9)

## 2022-10-22 LAB — PREGNANCY, URINE: Preg Test, Ur: NEGATIVE

## 2022-10-22 MED ORDER — LACTATED RINGERS IV BOLUS
1000.0000 mL | Freq: Once | INTRAVENOUS | Status: AC
  Administered 2022-10-22: 1000 mL via INTRAVENOUS

## 2022-10-22 MED ORDER — ACETAMINOPHEN 500 MG PO TABS
1000.0000 mg | ORAL_TABLET | Freq: Once | ORAL | Status: AC
  Administered 2022-10-22: 1000 mg via ORAL
  Filled 2022-10-22: qty 2

## 2022-10-22 MED ORDER — SODIUM CHLORIDE 0.9 % IV BOLUS
1000.0000 mL | Freq: Once | INTRAVENOUS | Status: AC
  Administered 2022-10-22: 1000 mL via INTRAVENOUS

## 2022-10-22 MED ORDER — MORPHINE SULFATE (PF) 4 MG/ML IV SOLN
INTRAVENOUS | Status: AC
  Administered 2022-10-22: 4 mg via INTRAVENOUS
  Filled 2022-10-22: qty 1

## 2022-10-22 MED ORDER — SODIUM CHLORIDE 0.9 % IV SOLN
2.0000 g | INTRAVENOUS | Status: DC
  Administered 2022-10-22 – 2022-10-24 (×3): 2 g via INTRAVENOUS
  Filled 2022-10-22 (×3): qty 20

## 2022-10-22 MED ORDER — IOHEXOL 300 MG/ML  SOLN
100.0000 mL | Freq: Once | INTRAMUSCULAR | Status: AC | PRN
  Administered 2022-10-22: 100 mL via INTRAVENOUS

## 2022-10-22 MED ORDER — MORPHINE SULFATE (PF) 4 MG/ML IV SOLN: 4.0000 mg | Freq: Once | INTRAVENOUS | Status: AC

## 2022-10-22 NOTE — ED Provider Notes (Signed)
[facility] Provider Note    Event Date/Time   First MD Initiated Contact with Patient 10/22/22 2056     (approximate)   History   Dysuria   HPI  Gwendolyn Garrett is a 22 y.o. female presenting to the emergency department for evaluation of dysuria.  About 2 weeks ago she began to notice dysuria with associated hematuria that she had been on her menses so she is not sure if the blood was coming from that.  About a week ago she noticed some worsening lower back pain.  Today, patient noticed onset of fevers and chills.  She has been drinking cranberry juice, but has not taken any antibiotics.      Physical Exam   Triage Vital Signs: ED Triage Vitals [10/22/22 2010]  Encounter Vitals Group     BP 93/79     Systolic BP Percentile      Diastolic BP Percentile      Pulse Rate (!) 125     Resp (!) 22     Temp (!) 101.4 F (38.6 C)     Temp Source Oral     SpO2 100 %     Weight 213 lb (96.6 kg)     Height 6' (1.829 m)     Head Circumference      Peak Flow      Pain Score 10     Pain Loc      Pain Education      Exclude from Growth Chart     Most recent vital signs: Vitals:   10/22/22 2010 10/22/22 2306  BP: 93/79 130/81  Pulse: (!) 125 91  Resp: (!) 22 17  Temp: (!) 101.4 F (38.6 C) 98.7 F (37.1 C)  SpO2: 100% 99%     General: Awake, interactive  CV:  Regular rate, good peripheral perfusion.  Resp:  Lungs clear, unlabored respirations.  Abd:  Soft, nondistended, tender to palpation in the suprapubic region, tenderness over the right flank extending into the right lower back  Neuro:  Symmetric facial movement, fluid speech   ED Results / Procedures / Treatments   Labs (all labs ordered are listed, but only abnormal results are displayed) Labs Reviewed  CBC WITH DIFFERENTIAL/PLATELET - Abnormal; Notable for the following components:      Result Value   WBC 12.5 (*)    Neutro Abs 8.2 (*)    All other components within normal  limits  COMPREHENSIVE METABOLIC PANEL - Abnormal; Notable for the following components:   Sodium 134 (*)    Potassium 3.2 (*)    Glucose, Bld 103 (*)    Total Protein 8.2 (*)    Total Bilirubin 1.4 (*)    All other components within normal limits  URINALYSIS, ROUTINE W REFLEX MICROSCOPIC - Abnormal; Notable for the following components:   Color, Urine YELLOW (*)    APPearance CLOUDY (*)    Hgb urine dipstick MODERATE (*)    Protein, ur 100 (*)    Leukocytes,Ua LARGE (*)    All other components within normal limits  CULTURE, BLOOD (ROUTINE X 2)  CULTURE, BLOOD (ROUTINE X 2)  URINE CULTURE  RESP PANEL BY RT-PCR (RSV, FLU A&B, COVID)  RVPGX2  LACTIC ACID, PLASMA  PREGNANCY, URINE     EKG EKG independently reviewed interpreted by myself (ER attending) demonstrates:    RADIOLOGY Imaging independently reviewed and interpreted by myself demonstrates:    PROCEDURES:  Critical Care performed: Yes, see critical care  procedure note(s)  CRITICAL CARE Performed by: Trinna Post   Total critical care time: 30 minutes  Critical care time was exclusive of separately billable procedures and treating other patients.  Critical care was necessary to treat or prevent imminent or life-threatening deterioration.  Critical care was time spent personally by me on the following activities: development of treatment plan with patient and/or surrogate as well as nursing, discussions with consultants, evaluation of patient's response to treatment, examination of patient, obtaining history from patient or surrogate, ordering and performing treatments and interventions, ordering and review of laboratory studies, ordering and review of radiographic studies, pulse oximetry and re-evaluation of patient's condition.   Procedures   MEDICATIONS ORDERED IN ED: Medications  cefTRIAXone (ROCEPHIN) 2 g in sodium chloride 0.9 % 100 mL IVPB (2 g Intravenous New Bag/Given 10/22/22 2312)  acetaminophen (TYLENOL)  tablet 1,000 mg (1,000 mg Oral Given 10/22/22 2018)  lactated ringers bolus 1,000 mL (0 mLs Intravenous Stopped 10/22/22 2307)  morphine (PF) 4 MG/ML injection 4 mg (4 mg Intravenous Given 10/22/22 2155)  iohexol (OMNIPAQUE) 300 MG/ML solution 100 mL (100 mLs Intravenous Contrast Given 10/22/22 2204)  sodium chloride 0.9 % bolus 1,000 mL (1,000 mLs Intravenous Bolus 10/22/22 2313)     IMPRESSION / MDM / ASSESSMENT AND PLAN / ED COURSE  I reviewed the triage vital signs and the nursing notes.  Differential diagnosis includes, but is not limited to, UTI, pyelonephritis, renal stone, other acute intra-abdominal process  Patient's presentation is most consistent with acute presentation with potential threat to life or bodily function.  22 year old female presenting to the emergency department for evaluation of dysuria and back pain.  Febrile and tachycardic with tachypnea on presentation.  Does meet sepsis criteria.  CBC with leukocytosis with WBC of 12, CMP with hypokalemia with a K of 3.2.  Urinalysis is concerning for infection with large leukocyte Estrace, greater than 50 white blood cells. CT abdomen pelvis demonstrates thickened urinary bladder with adjacent fat stranding, no nephrolithiasis or hydronephrosis noted, though clinical history concerning for developing pyelonephritis.  After fluids and antipyretics, patient did have improvement in her heart rates to the low 100s.  She does have a documented allergy to penicillins, denies airway swelling.  I did review an ED visit in 2020 at which time she was administered Rocephin without documentation of allergy.  In the setting of this, do think it is reasonable to proceed with Rocephin which has been ordered.  Will reach out to hospitalist team to discuss admission.  Signed out to oncoming provider pending discussion with hospitalist team, anticipated admission.    FINAL CLINICAL IMPRESSION(S) / ED DIAGNOSES   Final diagnoses:  Acute cystitis with  hematuria     Rx / DC Orders   ED Discharge Orders     None        Note:  This document was prepared using Dragon voice recognition software and may include unintentional dictation errors.   Trinna Post, MD 10/22/22 [phone]

## 2022-10-22 NOTE — Sepsis Progress Note (Signed)
Elink monitoring for the code sepsis protocol.  

## 2022-10-22 NOTE — ED Notes (Signed)
POC Preg Final Result: NEGATIVE 

## 2022-10-22 NOTE — ED Provider Notes (Signed)
Plan to admit to hospitliast, Dr. Rosalia Hammers awaiting call back, or if not call back shortly, plan is for me to contact and request admit.    ----------------------------------------- 11:57 PM on 10/22/2022 -----------------------------------------  consulted with and patient accepted to hospital service by Dr. Chrissie Noa, MD 10/22/22 2357

## 2022-10-22 NOTE — ED Triage Notes (Signed)
Pt presents ambulatory to triage via POV with complaints of dysuria and back pain x 2 weeks. Pt endorses some hematuria but is unsure if it was residual from her menses. Rates the pain 10/10 with radiation to her legs. A&Ox4 at this time. Denies CP or SOB.

## 2022-10-22 NOTE — Progress Notes (Signed)
CODE SEPSIS - PHARMACY COMMUNICATION  **Broad Spectrum Antibiotics should be administered within 1 hour of Sepsis diagnosis**  Time Code Sepsis Called/Page Received: 2248  Antibiotics Ordered: Ceftriaxone  Time of 1st antibiotic administration: 2312  Otelia Sergeant, PharmD, [facility] 10/22/2022 10:55 PM

## 2022-10-22 NOTE — ED Notes (Signed)
Lab contacted on behalf of the patients POC preg test - Test was negative; computer error in crossing over the results. Primary RN notified as well as CT tech Minerva Areola).

## 2022-10-23 DIAGNOSIS — N390 Urinary tract infection, site not specified: Secondary | ICD-10-CM | POA: Diagnosis not present

## 2022-10-23 DIAGNOSIS — A419 Sepsis, unspecified organism: Secondary | ICD-10-CM | POA: Diagnosis not present

## 2022-10-23 DIAGNOSIS — Z20822 Contact with and (suspected) exposure to covid-19: Secondary | ICD-10-CM | POA: Diagnosis present

## 2022-10-23 DIAGNOSIS — E876 Hypokalemia: Secondary | ICD-10-CM | POA: Diagnosis present

## 2022-10-23 DIAGNOSIS — B9620 Unspecified Escherichia coli [E. coli] as the cause of diseases classified elsewhere: Secondary | ICD-10-CM | POA: Diagnosis present

## 2022-10-23 DIAGNOSIS — Z880 Allergy status to penicillin: Secondary | ICD-10-CM | POA: Diagnosis not present

## 2022-10-23 DIAGNOSIS — R52 Pain, unspecified: Secondary | ICD-10-CM | POA: Diagnosis not present

## 2022-10-23 DIAGNOSIS — Z87440 Personal history of urinary (tract) infections: Secondary | ICD-10-CM | POA: Diagnosis not present

## 2022-10-23 DIAGNOSIS — A4151 Sepsis due to Escherichia coli [E. coli]: Secondary | ICD-10-CM | POA: Diagnosis not present

## 2022-10-23 DIAGNOSIS — A498 Other bacterial infections of unspecified site: Secondary | ICD-10-CM | POA: Diagnosis not present

## 2022-10-23 DIAGNOSIS — R300 Dysuria: Secondary | ICD-10-CM | POA: Diagnosis not present

## 2022-10-23 HISTORY — DX: Sepsis, unspecified organism: A41.9

## 2022-10-23 HISTORY — DX: Hypokalemia: E87.6

## 2022-10-23 LAB — RESP PANEL BY RT-PCR (RSV, FLU A&B, COVID)  RVPGX2
Influenza A by PCR: NEGATIVE
Influenza B by PCR: NEGATIVE
Resp Syncytial Virus by PCR: NEGATIVE
SARS Coronavirus 2 by RT PCR: NEGATIVE

## 2022-10-23 LAB — CREATININE, SERUM
Creatinine, Ser: 0.82 mg/dL (ref 0.44–1.00)
GFR, Estimated: >60 mL/min (ref >60)

## 2022-10-23 LAB — CBC
HCT: 34.1 % — ABNORMAL LOW (ref 36.0–46.0)
Hemoglobin: 11.7 g/dL — ABNORMAL LOW (ref 12.0–15.0)
MCH: 29.4 pg (ref 26.0–34.0)
MCHC: 34.3 g/dL (ref 30.0–36.0)
MCV: 85.7 fL (ref 80.0–100.0)
Platelets: 217 10*3/uL (ref 150–400)
RBC: 3.98 MIL/uL (ref 3.87–5.11)
RDW: 12.1 % (ref 11.5–15.5)
WBC: 8.4 10*3/uL (ref 4.0–10.5)
nRBC: 0 % (ref 0.0–0.2)

## 2022-10-23 LAB — CHLAMYDIA/NGC RT PCR (ARMC ONLY)
Chlamydia Tr: NOT DETECTED
N gonorrhoeae: NOT DETECTED

## 2022-10-23 LAB — HEPATITIS PANEL, ACUTE
HCV Ab: NONREACTIVE
Hep A IgM: NONREACTIVE
Hep B C IgM: NONREACTIVE
Hepatitis B Surface Ag: NONREACTIVE

## 2022-10-23 LAB — CULTURE, BLOOD (ROUTINE X 2): Special Requests: ADEQUATE

## 2022-10-23 LAB — PROTIME-INR
INR: 1.2 (ref 0.8–1.2)
Prothrombin Time: 14.9 seconds (ref 11.4–15.2)

## 2022-10-23 LAB — RAPID HIV SCREEN (HIV 1/2 AB+AG)
HIV 1/2 Antibodies: NONREACTIVE
HIV-1 P24 Antigen - HIV24: NONREACTIVE

## 2022-10-23 LAB — PROCALCITONIN: Procalcitonin: <0.1 ng/mL

## 2022-10-23 LAB — HEMOGLOBIN A1C
Hgb A1c MFr Bld: 4.7 % — ABNORMAL LOW (ref 4.8–5.6)
Mean Plasma Glucose: 88.19 mg/dL

## 2022-10-23 LAB — CORTISOL-AM, BLOOD: Cortisol - AM: 11.7 ug/dL (ref 6.7–22.6)

## 2022-10-23 LAB — HIV ANTIBODY (ROUTINE TESTING W REFLEX): HIV Screen 4th Generation wRfx: NONREACTIVE

## 2022-10-23 MED ORDER — HYDROCODONE-ACETAMINOPHEN 5-325 MG PO TABS
1.0000 | ORAL_TABLET | Freq: Four times a day (QID) | ORAL | Status: AC | PRN
  Administered 2022-10-23 – 2022-10-24 (×5): 1 via ORAL
  Filled 2022-10-23 (×6): qty 1

## 2022-10-23 MED ORDER — SODIUM CHLORIDE 0.9% FLUSH
3.0000 mL | Freq: Two times a day (BID) | INTRAVENOUS | Status: DC
  Administered 2022-10-23: 3 mL via INTRAVENOUS

## 2022-10-23 MED ORDER — ONDANSETRON HCL 4 MG/2ML IJ SOLN: 4.0000 mg | Freq: Four times a day (QID) | INTRAMUSCULAR | Status: DC | PRN

## 2022-10-23 MED ORDER — ONDANSETRON HCL 4 MG PO TABS: 4.0000 mg | ORAL_TABLET | Freq: Four times a day (QID) | ORAL | Status: DC | PRN

## 2022-10-23 MED ORDER — HEPARIN SODIUM (PORCINE) 5000 UNIT/ML IJ SOLN
5000.0000 [IU] | Freq: Three times a day (TID) | INTRAMUSCULAR | Status: DC
  Administered 2022-10-23: 5000 [IU] via SUBCUTANEOUS
  Filled 2022-10-23 (×2): qty 1

## 2022-10-23 MED ORDER — ACETAMINOPHEN 325 MG PO TABS: 650.0000 mg | ORAL_TABLET | Freq: Four times a day (QID) | ORAL | Status: DC | PRN

## 2022-10-23 MED ORDER — LACTATED RINGERS IV SOLN: INTRAVENOUS | Status: DC

## 2022-10-23 MED ORDER — ACETAMINOPHEN 650 MG RE SUPP: 650.0000 mg | Freq: Four times a day (QID) | RECTAL | Status: DC | PRN

## 2022-10-23 MED ORDER — HEPARIN SODIUM (PORCINE) 5000 UNIT/ML IJ SOLN
5000.0000 [IU] | Freq: Three times a day (TID) | INTRAMUSCULAR | Status: DC
  Administered 2022-10-23 – 2022-10-24 (×4): 5000 [IU] via SUBCUTANEOUS
  Filled 2022-10-23 (×6): qty 1

## 2022-10-23 NOTE — Assessment & Plan Note (Signed)
Patient coming in with sepsis secondary to urinary tract infection.  Patient's been having fever dysuria flank pain and hematuria with an abnormal urinalysis.  In the emergency room patient is given Rocephin. Will follow blood cultures and urine cultures. Gentle IV fluid hydration overnight with LR at 75 cc/h.

## 2022-10-23 NOTE — Progress Notes (Signed)
Interim progress note not for billing.  Have reviewed chart, seen and examined patient. Agree with h and p unless otherwise stated. Otherwise healthy non-pregnant female with two weeks dysuria, urgency and now more recently fever and chills. Here febrile, urinalysis suggestive of UTI. CT with signs cystitis, no other acute pathology including no signs pyelo and patient has not flank tenderness. Think makes sense to observe overnight and f/u cultures (would want at least 24 hours of blood cultures), potential d/c tomorrow assuming remains stable. She is already feeling much improved.

## 2022-10-23 NOTE — Assessment & Plan Note (Signed)
Replace and follow levels. Will give patient potassium p.o. 40 mg x 1.

## 2022-10-23 NOTE — H&P (Signed)
History and Physical    Patient: Gwendolyn Garrett ZOX:[id number] DOB: 01-11-2001 DOA: 10/22/2022 DOS: the patient was seen and examined on 10/23/2022 PCP: Patient, No Pcp Per  Patient coming from: Home   Chief Complaint:  Chief Complaint  Patient presents with   Dysuria    HPI: Gwendolyn Garrett is a 22 y.o. female with medical history significant for penicillin allergy presenting with fevers and chills.  Patient has been having dysuria for about 2 weeks and some back pain frequency and also  is on her menses.  Patient states that because of the fever and chills she decided to come to the hospital.  Patient also noticed hematuria.  No over-the-counter meds.  No reports of chest pain shortness of breath palpitation headaches blurred vision speech or gait issue.  In the emergency room patient meets sepsis criteria with heart rate vitals and source of infection with leukocytosis.  Cultures collected in the ED emergency room.  Patient found to have leukocytosis of 12.5 hypokalemia 3.2 cloudy urine with large leukocytes moderate hemoglobin.  Lactic acid collected and pending.  Admission requested for UTI, sepsis. In the emergency room patient was given Tylenol for fever LR 1 L bolus with a repeat x 1.  Cultures collected in the ER blood cultures x 2.  Review of Systems: Review of Systems  Constitutional:  Positive for chills and fever.  Genitourinary:  Positive for dysuria, flank pain, frequency and hematuria.  All other systems reviewed and are negative.  No past medical history on file. Past Surgical History:  Procedure Laterality Date   NO PAST SURGERIES     WISDOM TOOTH EXTRACTION     all four; age 89   Social History:  reports that she has never smoked. She has never used smokeless tobacco. She reports that she does not currently use drugs. She reports that she does not drink alcohol.  Allergies  Allergen Reactions   Penicillins Hives    No family history on file.  Prior to  Admission medications   Medication Sig Start Date End Date Taking? Authorizing Provider  mupirocin ointment (BACTROBAN) 2 % Apply 1 Application topically 2 (two) times daily. 08/17/22   Eusebio Friendly B, PA-C     Vitals:   10/22/22 2010 10/22/22 2306  BP: 93/79 130/81  Pulse: (!) 125 91  Resp: (!) 22 17  Temp: (!) 101.4 F (38.6 C) 98.7 F (37.1 C)  TempSrc: Oral Oral  SpO2: 100% 99%  Weight: 96.6 kg   Height: 6' (1.829 m)    Physical Exam Vitals and nursing note reviewed.  Constitutional:      General: She is not in acute distress. HENT:     Head: Normocephalic and atraumatic.     Right Ear: Hearing normal.     Left Ear: Hearing normal.     Nose: Nose normal. No nasal deformity.     Mouth/Throat:     Lips: Pink.     Tongue: No lesions.     Pharynx: Oropharynx is clear.  Eyes:     General: Lids are normal.     Extraocular Movements: Extraocular movements intact.  Cardiovascular:     Rate and Rhythm: Normal rate and regular rhythm.     Heart sounds: Normal heart sounds.  Pulmonary:     Effort: Pulmonary effort is normal.     Breath sounds: Normal breath sounds.  Abdominal:     General: Bowel sounds are normal. There is no distension.     Palpations: Abdomen is  soft. There is no mass.     Tenderness: There is no abdominal tenderness.  Musculoskeletal:     Right lower leg: No edema.     Left lower leg: No edema.  Skin:    General: Skin is warm.  Neurological:     General: No focal deficit present.     Mental Status: She is alert and oriented to person, place, and time.     Cranial Nerves: Cranial nerves 2-12 are intact.  Psychiatric:        Attention and Perception: Attention normal.        Mood and Affect: Mood normal.        Speech: Speech normal.        Behavior: Behavior normal. Behavior is cooperative.      Labs on Admission: I have personally reviewed following labs and imaging studies  CBC: Recent Labs  Lab 10/22/22 2015  WBC 12.5*  NEUTROABS  8.2*  HGB 13.7  HCT 39.1  MCV 84.6  PLT 261   Basic Metabolic Panel: Recent Labs  Lab 10/22/22 2015  NA 134*  K 3.2*  CL 103  CO2 23  GLUCOSE 103*  BUN 8  CREATININE 1.00  CALCIUM 9.1   GFR: Estimated Creatinine Clearance: 114 mL/min (by C-G formula based on SCr of 1 mg/dL). Liver Function Tests: Recent Labs  Lab 10/22/22 2015  AST 40  ALT 30  ALKPHOS 51  BILITOT 1.4*  PROT 8.2*  ALBUMIN 4.4   No results for input(s): "LIPASE", "AMYLASE" in the last 168 hours. No results for input(s): "AMMONIA" in the last 168 hours. Coagulation Profile: No results for input(s): "INR", "PROTIME" in the last 168 hours. Cardiac Enzymes: No results for input(s): "CKTOTAL", "CKMB", "CKMBINDEX", "TROPONINI" in the last 168 hours. BNP (last 3 results) No results for input(s): "PROBNP" in the last 8760 hours. HbA1C: No results for input(s): "HGBA1C" in the last 72 hours. CBG: No results for input(s): "GLUCAP" in the last 168 hours. Lipid Profile: No results for input(s): "CHOL", "HDL", "LDLCALC", "TRIG", "CHOLHDL", "LDLDIRECT" in the last 72 hours. Thyroid Function Tests: No results for input(s): "TSH", "T4TOTAL", "FREET4", "T3FREE", "THYROIDAB" in the last 72 hours. Anemia Panel: No results for input(s): "VITAMINB12", "FOLATE", "FERRITIN", "TIBC", "IRON", "RETICCTPCT" in the last 72 hours. Urine analysis: Urinalysis    Component Value Date/Time   COLORURINE YELLOW (A) 10/22/2022 2014   APPEARANCEUR CLOUDY (A) 10/22/2022 2014   LABSPEC 1.011 10/22/2022 2014   PHURINE 7.0 10/22/2022 2014   GLUCOSEU NEGATIVE 10/22/2022 2014   HGBUR MODERATE (A) 10/22/2022 2014   BILIRUBINUR NEGATIVE 10/22/2022 2014   KETONESUR NEGATIVE 10/22/2022 2014   PROTEINUR 100 (A) 10/22/2022 2014   NITRITE NEGATIVE 10/22/2022 2014   LEUKOCYTESUR LARGE (A) 10/22/2022 2014    Unresulted Labs (From admission, onward)     Start     Ordered   10/23/22 0500  Protime-INR  Tomorrow morning,   STAT         10/23/22 0059   10/23/22 0500  Cortisol-am, blood  Tomorrow morning,   URGENT        10/23/22 0059   10/23/22 0500  Procalcitonin  Tomorrow morning,   URGENT       References:    Procalcitonin Lower Respiratory Tract Infection AND Sepsis Procalcitonin Algorithm   10/23/22 0059   10/23/22 0057  Hemoglobin A1c  Add-on,   AD        10/23/22 0059   10/23/22 0056  HIV Antibody (routine testing w  rflx)  (HIV Antibody (Routine testing w reflex) panel)  Once,   URGENT        10/23/22 0059   10/23/22 0056  CBC  (heparin)  Once,   STAT       Comments: Baseline for heparin therapy IF NOT ALREADY DRAWN.  Notify MD if PLT < 100 K.    10/23/22 0059   10/23/22 0056  Creatinine, serum  (heparin)  Once,   STAT       Comments: Baseline for heparin therapy IF NOT ALREADY DRAWN.    10/23/22 0059   10/22/22 2244  Resp panel by RT-PCR (RSV, Flu A&B, Covid) Anterior Nasal Swab  (Septic presentation on arrival (screening labs, nursing and treatment orders for obvious sepsis))  Once,   URGENT        10/22/22 2249   10/22/22 2112  Urine Culture  Add-on,   AD       Question:  Indication  Answer:  Dysuria   10/22/22 2111   10/22/22 2014  Blood culture (routine x 2)  BLOOD CULTURE X 2,   STAT      10/22/22 2013             Radiological Exams on Admission: CT ABDOMEN PELVIS W CONTRAST  Result Date: 10/22/2022 CLINICAL DATA:  Urinary tract infection.  Flank pain.  Dysuria. EXAM: CT ABDOMEN AND PELVIS WITH CONTRAST TECHNIQUE: Multidetector CT imaging of the abdomen and pelvis was performed using the standard protocol following bolus administration of intravenous contrast. RADIATION DOSE REDUCTION: This exam was performed according to the departmental dose-optimization program which includes automated exposure control, adjustment of the mA and/or kV according to patient size and/or use of iterative reconstruction technique. CONTRAST:  OMNIPAQUE IOHEXOL 300 MG/ML  SOLN COMPARISON:  CT examination dated  April 11, 2018 FINDINGS: Lower chest: No acute abnormality. Hepatobiliary: No focal liver abnormality is seen. No gallstones, gallbladder wall thickening, or biliary dilatation. Pancreas: Unremarkable. No pancreatic ductal dilatation or surrounding inflammatory changes. Spleen: Normal in size without focal abnormality. Adrenals/Urinary Tract: Adrenal glands are unremarkable. Kidneys are normal, without renal calculi, focal lesion, or hydronephrosis. Thickening of the urinary bladder wall with mild adjacent fat stranding suggesting cystitis. Stomach/Bowel: Stomach is within normal limits. Appendix appears normal. No evidence of bowel wall thickening, distention, or inflammatory changes. Vascular/Lymphatic: No significant vascular findings are present. No enlarged abdominal or pelvic lymph nodes. Reproductive: Uterus and bilateral adnexa are unremarkable. Other: No abdominal wall hernia or abnormality. No abdominopelvic ascites. Musculoskeletal: No acute or significant osseous findings. IMPRESSION: 1. Thickening of the urinary bladder wall with mild adjacent fat stranding suggesting cystitis. Correlation with urinalysis is suggested. 2. No evidence of nephrolithiasis or hydronephrosis. 3. Bowel loops are normal in caliber. Normal appendix. No evidence of colitis or diverticulitis. 4. Uterus and adnexa are unremarkable. Electronically Signed   By: Larose Hires D.O.   On: 10/22/2022 22:29     Data Reviewed: Relevant notes from primary care and specialist visits, past discharge summaries as available in EHR, including Care Everywhere. Prior diagnostic testing as pertinent to current admission diagnoses Updated medications and problem lists for reconciliation ED course, including vitals, labs, imaging, treatment and response to treatment Triage notes, nursing and pharmacy notes and ED provider's notes Notable results as noted in HPI Assessment and Plan: * Sepsis secondary to UTI [facility]) Patient coming in with  sepsis secondary to urinary tract infection.  Patient's been having fever dysuria flank pain and hematuria with an abnormal urinalysis.  In the emergency room patient is given Rocephin. Will follow blood cultures and urine cultures. Gentle IV fluid hydration overnight with LR at 75 cc/h.   Hypokalemia Replace and follow levels. Will give patient potassium p.o. 40 mg x 1.    DVT prophylaxis:  Heparin   Consults:  None   Advance Care Planning:    Code Status: Prior  Family Communication:  None  Disposition Plan:  Back to previous home environment  Severity of Illness: The appropriate patient status for this patient is INPATIENT. Inpatient status is judged to be reasonable and necessary in order to provide the required intensity of service to ensure the patient's safety. The patient's presenting symptoms, physical exam findings, and initial radiographic and laboratory data in the context of their chronic comorbidities is felt to place them at high risk for further clinical deterioration. Furthermore, it is not anticipated that the patient will be medically stable for discharge from the hospital within 2 midnights of admission.   * I certify that at the point of admission it is my clinical judgment that the patient will require inpatient hospital care spanning beyond 2 midnights from the point of admission due to high intensity of service, high risk for further deterioration and high frequency of surveillance required.*  Author: Gertha Calkin, MD 10/23/2022 12:59 AM  For on call review [email or web address].

## 2022-10-24 ENCOUNTER — Encounter: Payer: Self-pay | Admitting: Internal Medicine

## 2022-10-24 DIAGNOSIS — A498 Other bacterial infections of unspecified site: Secondary | ICD-10-CM

## 2022-10-24 DIAGNOSIS — E876 Hypokalemia: Secondary | ICD-10-CM

## 2022-10-24 DIAGNOSIS — R52 Pain, unspecified: Secondary | ICD-10-CM | POA: Diagnosis not present

## 2022-10-24 DIAGNOSIS — A419 Sepsis, unspecified organism: Secondary | ICD-10-CM | POA: Diagnosis not present

## 2022-10-24 LAB — CULTURE, BLOOD (ROUTINE X 2)
Culture: NO GROWTH
Culture: NO GROWTH

## 2022-10-24 LAB — RPR: RPR Ser Ql: NONREACTIVE

## 2022-10-24 LAB — URINE CULTURE

## 2022-10-24 MED ORDER — HYDROCODONE-ACETAMINOPHEN 5-325 MG PO TABS
1.0000 | ORAL_TABLET | Freq: Four times a day (QID) | ORAL | Status: DC | PRN
  Administered 2022-10-24: 1 via ORAL
  Filled 2022-10-24: qty 1

## 2022-10-24 NOTE — Progress Notes (Signed)
Progress Note   Patient: Gwendolyn Garrett ZOX:[id number] DOB: 10-08-2000 DOA: 10/22/2022     1 DOS: the patient was seen and examined on 10/24/2022   Brief hospital course: Gwendolyn Garrett is a 22 y.o. female with medical history significant for penicillin allergy presenting with fevers and chills.  Patient has been having dysuria for about 2 weeks and some back pain frequency and also  is on her menses.  Patient states that because of the fever and chills she decided to come to the hospital.  Patient also noticed hematuria.  In the emergency room patient meets sepsis criteria with heart rate vitals and source of infection with leukocytosis.  Cultures collected in the ED emergency room.  Patient found to have leukocytosis of 12.5 hypokalemia 3.2 cloudy urine with large leukocytes moderate hemoglobin.  Lactic acid collected and pending.  Admission requested for UTI, sepsis.   Assessment and Plan: * Sepsis secondary to UTI (HCC) E.coli UTI Patient coming in with sepsis secondary to urinary tract infection.   Patient's been having fever dysuria flank pain and hematuria with an abnormal urinalysis.  Urine cultures reviewed, continue Rocephin therapy. Await sensitivities. Blood cultures negative. Stop IV hydration, encourage oral fluids. Advised urology, gyn eval as outpatient for recurrent UTI.  Hypokalemia Replaced, will get repeat BMP.      Subjective: Patient is seen and examined today morning, she has lower abdominal pain. Asking for pain meds. No nausea or vomiting. States she feels better today,  Physical Exam: Vitals:   10/23/22 1444 10/23/22 2305 10/24/22 0813 10/24/22 1200  BP: 115/70 119/82 118/81 115/78  Pulse: 83 72 65 (!) 59  Resp: 17 18 18 18   Temp: 98.4 F (36.9 C) 98.5 F (36.9 C) (!) 97.5 F (36.4 C) 98.2 F (36.8 C)  TempSrc: Oral Oral Oral Oral  SpO2: 100% 99% 100% 100%  Weight:      Height:       General - Young African American female, distress due to  pain HEENT - PERRLA, EOMI, atraumatic head, non tender sinuses. Lung - Clear,  no rales, wheezes, rhonchi Heart - S1, S2 heard, no murmurs, rubs, no pedal edema Neuro - Alert, awake and oriented x 3, non focal exam. Skin - Warm and dry. Data Reviewed:     Latest Ref Rng & Units 10/23/2022    7:40 AM 10/22/2022    8:15 PM 09/19/2021    8:45 AM  CBC  WBC 4.0 - 10.5 K/uL 8.4  12.5  5.4   Hemoglobin 12.0 - 15.0 g/dL 81.1  91.4  78.2   Hematocrit 36.0 - 46.0 % 34.1  39.1  38.7   Platelets 150 - 400 K/uL 217  261  266        Latest Ref Rng & Units 10/23/2022    7:40 AM 10/22/2022    8:15 PM 04/11/2018    8:58 PM  BMP  Glucose 70 - 99 mg/dL  956  213   BUN 6 - 20 mg/dL  8  6   Creatinine 0.86 - 1.00 mg/dL 5.78  4.69  6.29   Sodium 135 - 145 mmol/L  134  132   Potassium 3.5 - 5.1 mmol/L  3.2  3.0   Chloride 98 - 111 mmol/L  103  101   CO2 22 - 32 mmol/L  23  22   Calcium 8.9 - 10.3 mg/dL  9.1  9.1    Results for orders placed or performed during the hospital encounter of 10/22/22  Blood culture (routine x 2)     Status: None (Preliminary result)   Collection Time: 10/22/22  8:15 PM   Specimen: BLOOD  Result Value Ref Range Status   Specimen Description BLOOD BLOOD RIGHT FOREARM  Final   Special Requests   Final    BOTTLES DRAWN AEROBIC AND ANAEROBIC Blood Culture results may not be optimal due to an excessive volume of blood received in culture bottles   Culture   Final    NO GROWTH 2 DAYS Performed at [facility], [address], [place], [place] [address]    Report Status PENDING  Incomplete  Urine Culture     Status: Abnormal (Preliminary result)   Collection Time: 10/22/22  8:16 PM   Specimen: Urine, Random  Result Value Ref Range Status   Specimen Description   Final    URINE, RANDOM Performed at [facility], [address] [place], [place] [address]    Special Requests   Final    NONE Performed at [facility], [address]  [place], [place] [address]    Culture 50,000 COLONIES/mL ESCHERICHIA COLI (A)  Final   Report Status PENDING  Incomplete  Chlamydia/NGC rt PCR ([facility] only)     Status: None   Collection Time: 10/22/22  8:16 PM   Specimen: Urine  Result Value Ref Range Status   Specimen source GC/Chlam URINE, RANDOM  Final   Chlamydia Tr NOT DETECTED NOT DETECTED Final   N gonorrhoeae NOT DETECTED NOT DETECTED Final    Comment: (NOTE) This CT/NG assay has not been evaluated in patients with a history of  hysterectomy. Performed at [facility], [address] [place], [place] [address]   Blood culture (routine x 2)     Status: None (Preliminary result)   Collection Time: 10/22/22  8:19 PM   Specimen: BLOOD  Result Value Ref Range Status   Specimen Description BLOOD LEFT ANTECUBITAL  Final   Special Requests   Final    BOTTLES DRAWN AEROBIC AND ANAEROBIC Blood Culture adequate volume   Culture   Final    NO GROWTH 2 DAYS Performed at [facility], [address] [place], [place] [address]    Report Status PENDING  Incomplete  Resp panel by RT-PCR (RSV, Flu A&B, Covid) Anterior Nasal Swab     Status: None   Collection Time: 10/22/22 11:14 PM   Specimen: Anterior Nasal Swab  Result Value Ref Range Status   SARS Coronavirus 2 by RT PCR NEGATIVE NEGATIVE Final    Comment: (NOTE) SARS-CoV-2 target nucleic acids are NOT DETECTED.  The SARS-CoV-2 RNA is generally detectable in upper respiratory specimens during the acute phase of infection. The lowest concentration of SARS-CoV-2 viral copies this assay can detect is 138 copies/mL. A negative result does not preclude SARS-Cov-2 infection and should not be used as the sole basis for treatment or other patient management decisions. A negative result may occur with  improper specimen collection/handling, submission of specimen other than nasopharyngeal swab, presence of viral mutation(s) within the areas targeted by this assay, and  inadequate number of viral copies(<138 copies/mL). A negative result must be combined with clinical observations, patient history, and epidemiological information. The expected result is Negative.  Fact Sheet for Patients:  BloggerCourse.com  Fact Sheet for Healthcare Providers:  SeriousBroker.it  This test is no t yet approved or cleared by the [place] FDA and  has been authorized for detection and/or diagnosis of SARS-CoV-2 by FDA  under an Emergency Use Authorization (EUA). This EUA will remain  in effect (meaning this test can be used) for the duration of the COVID-19 declaration under Section 564(b)(1) of the Act, 21 U.S.C.section 360bbb-3(b)(1), unless the authorization is terminated  or revoked sooner.       Influenza A by PCR NEGATIVE NEGATIVE Final   Influenza B by PCR NEGATIVE NEGATIVE Final    Comment: (NOTE) The Xpert Xpress SARS-CoV-2/FLU/RSV plus assay is intended as an aid in the diagnosis of influenza from Nasopharyngeal swab specimens and should not be used as a sole basis for treatment. Nasal washings and aspirates are unacceptable for Xpert Xpress SARS-CoV-2/FLU/RSV testing.  Fact Sheet for Patients: BloggerCourse.com  Fact Sheet for Healthcare Providers: SeriousBroker.it  This test is not yet approved or cleared by the [place] FDA and has been authorized for detection and/or diagnosis of SARS-CoV-2 by FDA under an Emergency Use Authorization (EUA). This EUA will remain in effect (meaning this test can be used) for the duration of the COVID-19 declaration under Section 564(b)(1) of the Act, 21 U.S.C. section 360bbb-3(b)(1), unless the authorization is terminated or revoked.     Resp Syncytial Virus by PCR NEGATIVE NEGATIVE Final    Comment: (NOTE) Fact Sheet for Patients: BloggerCourse.com  Fact Sheet for Healthcare  Providers: SeriousBroker.it  This test is not yet approved or cleared by the [place] FDA and has been authorized for detection and/or diagnosis of SARS-CoV-2 by FDA under an Emergency Use Authorization (EUA). This EUA will remain in effect (meaning this test can be used) for the duration of the COVID-19 declaration under Section 564(b)(1) of the Act, 21 U.S.C. section 360bbb-3(b)(1), unless the authorization is terminated or revoked.  Performed at [facility], [address] [place], [place] [address]      Family Communication: She understands and agree with above plan.  Disposition: Status is: Inpatient Remains inpatient appropriate because: IV antibiotics, pain meds  Planned Discharge Destination: Home    Time spent: 44 minutes  Author: Marcelino Duster, MD 10/24/2022 2:24 PM  For on call review [email or web address].

## 2022-10-25 ENCOUNTER — Other Ambulatory Visit: Payer: Self-pay

## 2022-10-25 LAB — URINE CULTURE: Culture: 50000 — AB

## 2022-10-25 LAB — CULTURE, BLOOD (ROUTINE X 2)

## 2022-10-25 MED ORDER — ACETAMINOPHEN 325 MG PO TABS
650.0000 mg | ORAL_TABLET | Freq: Four times a day (QID) | ORAL | 0 refills | Status: DC | PRN
  Filled 2022-10-25: qty 100, 13d supply, fill #0

## 2022-10-25 MED ORDER — CEFADROXIL 500 MG PO CAPS
1000.0000 mg | ORAL_CAPSULE | Freq: Two times a day (BID) | ORAL | 0 refills | Status: AC
  Filled 2022-10-25: qty 28, 7d supply, fill #0

## 2022-10-25 MED ORDER — ENOXAPARIN SODIUM 40 MG/0.4ML IJ SOSY: 40.0000 mg | PREFILLED_SYRINGE | INTRAMUSCULAR | Status: DC

## 2022-10-25 NOTE — Discharge Instructions (Signed)
Please follow up with your Primary Care Provider

## 2022-10-25 NOTE — Progress Notes (Signed)
Pt discharged home.  Discharge instructions, prescriptions and follow up appointment given to and reviewed with pt.  Pt verbalized understanding.  Escorted by auxillary. 

## 2022-10-25 NOTE — Discharge Summary (Signed)
Physician Discharge Summary   Patient: Gwendolyn Garrett MRN: [id number] DOB: 20-Jun-2000  Admit date:     10/22/2022  Discharge date: 10/25/22  Discharge Physician: Marcelino Duster   PCP: Patient, No Pcp Per   Recommendations at discharge:    PCP follow up in 1 week  Discharge Diagnoses: Principal Problem:   Sepsis secondary to UTI [facility]) Active Problems:   Hypokalemia  Resolved Problems:   * No resolved hospital problems. *  Hospital Course: Gwendolyn Garrett is a 22 y.o. female with medical history significant for penicillin allergy presenting with fevers and chills.  Patient has been having dysuria for about 2 weeks and some back pain frequency and also  is on her menses.  Patient states that because of the fever and chills she decided to come to the hospital.  Patient also noticed hematuria.  No over-the-counter meds.  No reports of chest pain shortness of breath palpitation headaches blurred vision speech or gait issue.  In the emergency room patient meets sepsis criteria with heart rate vitals and source of infection with leukocytosis.  Cultures collected in the ED emergency room.  Patient found to have leukocytosis of 12.5 hypokalemia 3.2 cloudy urine with large leukocytes moderate hemoglobin.  Lactic acid collected and pending.   Patient is admitted to hospitalist service for UTI, sepsis.  She was started on Rocephin therapy.  Patient's vitals closely monitored, electrolytes replaced.  Patient had lower abdominal pain which did improve with opiates.  Patient's urine cultures grew E. coli pansensitive.  Rocephin changed to cefadroxil therapy to complete total 7 days.  Patient's symptoms much improved, she is hemodynamically stable to be discharged home.  I advised her to follow-up with PCP, GYN and urology upon discharge as instructed.  She understands and agrees with discharge plan.       Consultants: none Procedures performed: none  Disposition: Home Diet recommendation:   Discharge Diet Orders (From admission, onward)     Start     Ordered   10/25/22 0000  Diet - low sodium heart healthy        10/25/22 1246           Regular diet DISCHARGE MEDICATION: Allergies as of 10/25/2022       Reactions   Penicillins Hives        Medication List     STOP taking these medications    mupirocin ointment 2 % Commonly known as: BACTROBAN       TAKE these medications    acetaminophen 325 MG tablet Commonly known as: TYLENOL Take 2 tablets (650 mg total) by mouth every 6 (six) hours as needed for fever or moderate pain (or Fever >/= 101).   cefadroxil 500 MG capsule Commonly known as: DURICEF Take 2 capsules (1,000 mg total) by mouth 2 (two) times daily for 7 days.        Discharge Exam: Filed Weights   10/22/22 2010  Weight: 96.6 kg   General - Young African American female, no distress HEENT - PERRLA, EOMI, atraumatic head, non tender sinuses. Lung - Clear,  no rales, wheezes, rhonchi Heart - S1, S2 heard, no murmurs, rubs, no pedal edema Neuro - Alert, awake and oriented x 3, non focal exam. Skin - Warm and dry.  Condition at discharge: stable  The results of significant diagnostics from this hospitalization (including imaging, microbiology, ancillary and laboratory) are listed below for reference.   Imaging Studies: CT ABDOMEN PELVIS W CONTRAST  Result Date: 10/22/2022 CLINICAL DATA:  Urinary  tract infection.  Flank pain.  Dysuria. EXAM: CT ABDOMEN AND PELVIS WITH CONTRAST TECHNIQUE: Multidetector CT imaging of the abdomen and pelvis was performed using the standard protocol following bolus administration of intravenous contrast. RADIATION DOSE REDUCTION: This exam was performed according to the departmental dose-optimization program which includes automated exposure control, adjustment of the mA and/or kV according to patient size and/or use of iterative reconstruction technique. CONTRAST:  OMNIPAQUE IOHEXOL 300 MG/ML  SOLN  COMPARISON:  CT examination dated April 11, 2018 FINDINGS: Lower chest: No acute abnormality. Hepatobiliary: No focal liver abnormality is seen. No gallstones, gallbladder wall thickening, or biliary dilatation. Pancreas: Unremarkable. No pancreatic ductal dilatation or surrounding inflammatory changes. Spleen: Normal in size without focal abnormality. Adrenals/Urinary Tract: Adrenal glands are unremarkable. Kidneys are normal, without renal calculi, focal lesion, or hydronephrosis. Thickening of the urinary bladder wall with mild adjacent fat stranding suggesting cystitis. Stomach/Bowel: Stomach is within normal limits. Appendix appears normal. No evidence of bowel wall thickening, distention, or inflammatory changes. Vascular/Lymphatic: No significant vascular findings are present. No enlarged abdominal or pelvic lymph nodes. Reproductive: Uterus and bilateral adnexa are unremarkable. Other: No abdominal wall hernia or abnormality. No abdominopelvic ascites. Musculoskeletal: No acute or significant osseous findings. IMPRESSION: 1. Thickening of the urinary bladder wall with mild adjacent fat stranding suggesting cystitis. Correlation with urinalysis is suggested. 2. No evidence of nephrolithiasis or hydronephrosis. 3. Bowel loops are normal in caliber. Normal appendix. No evidence of colitis or diverticulitis. 4. Uterus and adnexa are unremarkable. Electronically Signed   By: Larose Hires D.O.   On: 10/22/2022 22:29    Microbiology: Results for orders placed or performed during the hospital encounter of 10/22/22  Blood culture (routine x 2)     Status: None (Preliminary result)   Collection Time: 10/22/22  8:15 PM   Specimen: BLOOD  Result Value Ref Range Status   Specimen Description BLOOD BLOOD RIGHT FOREARM  Final   Special Requests   Final    BOTTLES DRAWN AEROBIC AND ANAEROBIC Blood Culture results may not be optimal due to an excessive volume of blood received in culture bottles   Culture    Final    NO GROWTH 3 DAYS Performed at [facility], [address] [place], [place] [address]    Report Status PENDING  Incomplete  Urine Culture     Status: Abnormal   Collection Time: 10/22/22  8:16 PM   Specimen: Urine, Random  Result Value Ref Range Status   Specimen Description   Final    URINE, RANDOM Performed at [facility], [address] [place], [place] [address]    Special Requests   Final    NONE Performed at [facility], [address] [place], [place] [address]    Culture 50,000 COLONIES/mL ESCHERICHIA COLI (A)  Final   Report Status 10/25/2022 FINAL  Final   Organism ID, Bacteria ESCHERICHIA COLI (A)  Final      Susceptibility   Escherichia coli - MIC*    AMPICILLIN 8 SENSITIVE Sensitive     CEFAZOLIN <=4 SENSITIVE Sensitive     CEFEPIME <=0.12 SENSITIVE Sensitive     CEFTRIAXONE <=0.25 SENSITIVE Sensitive     CIPROFLOXACIN <=0.25 SENSITIVE Sensitive     GENTAMICIN <=1 SENSITIVE Sensitive     IMIPENEM <=0.25 SENSITIVE Sensitive     NITROFURANTOIN <=16 SENSITIVE Sensitive     TRIMETH/SULFA <=20 SENSITIVE Sensitive     AMPICILLIN/SULBACTAM <=2 SENSITIVE Sensitive     PIP/TAZO <=4  SENSITIVE Sensitive     * 50,000 COLONIES/mL ESCHERICHIA COLI  Chlamydia/NGC rt PCR ([facility] only)     Status: None   Collection Time: 10/22/22  8:16 PM   Specimen: Urine  Result Value Ref Range Status   Specimen source GC/Chlam URINE, RANDOM  Final   Chlamydia Tr NOT DETECTED NOT DETECTED Final   N gonorrhoeae NOT DETECTED NOT DETECTED Final    Comment: (NOTE) This CT/NG assay has not been evaluated in patients with a history of  hysterectomy. Performed at [facility], [address] [place], [place] [address]   Blood culture (routine x 2)     Status: None (Preliminary result)   Collection Time: 10/22/22  8:19 PM   Specimen: BLOOD  Result Value Ref Range Status   Specimen Description BLOOD LEFT ANTECUBITAL  Final   Special  Requests   Final    BOTTLES DRAWN AEROBIC AND ANAEROBIC Blood Culture adequate volume   Culture   Final    NO GROWTH 3 DAYS Performed at [facility], [address], [place], [place] [address]    Report Status PENDING  Incomplete  Resp panel by RT-PCR (RSV, Flu A&B, Covid) Anterior Nasal Swab     Status: None   Collection Time: 10/22/22 11:14 PM   Specimen: Anterior Nasal Swab  Result Value Ref Range Status   SARS Coronavirus 2 by RT PCR NEGATIVE NEGATIVE Final    Comment: (NOTE) SARS-CoV-2 target nucleic acids are NOT DETECTED.  The SARS-CoV-2 RNA is generally detectable in upper respiratory specimens during the acute phase of infection. The lowest concentration of SARS-CoV-2 viral copies this assay can detect is 138 copies/mL. A negative result does not preclude SARS-Cov-2 infection and should not be used as the sole basis for treatment or other patient management decisions. A negative result may occur with  improper specimen collection/handling, submission of specimen other than nasopharyngeal swab, presence of viral mutation(s) within the areas targeted by this assay, and inadequate number of viral copies(<138 copies/mL). A negative result must be combined with clinical observations, patient history, and epidemiological information. The expected result is Negative.  Fact Sheet for Patients:  BloggerCourse.com  Fact Sheet for Healthcare Providers:  SeriousBroker.it  This test is no t yet approved or cleared by the [place] FDA and  has been authorized for detection and/or diagnosis of SARS-CoV-2 by FDA under an Emergency Use Authorization (EUA). This EUA will remain  in effect (meaning this test can be used) for the duration of the COVID-19 declaration under Section 564(b)(1) of the Act, 21 U.S.C.section 360bbb-3(b)(1), unless the authorization is terminated  or revoked sooner.       Influenza A by PCR  NEGATIVE NEGATIVE Final   Influenza B by PCR NEGATIVE NEGATIVE Final    Comment: (NOTE) The Xpert Xpress SARS-CoV-2/FLU/RSV plus assay is intended as an aid in the diagnosis of influenza from Nasopharyngeal swab specimens and should not be used as a sole basis for treatment. Nasal washings and aspirates are unacceptable for Xpert Xpress SARS-CoV-2/FLU/RSV testing.  Fact Sheet for Patients: BloggerCourse.com  Fact Sheet for Healthcare Providers: SeriousBroker.it  This test is not yet approved or cleared by the [place] FDA and has been authorized for detection and/or diagnosis of SARS-CoV-2 by FDA under an Emergency Use Authorization (EUA). This EUA will remain in effect (meaning this test can be used) for the duration of the COVID-19 declaration under Section 564(b)(1) of the Act, 21 U.S.C. section 360bbb-3(b)(1), unless the authorization is terminated  or revoked.     Resp Syncytial Virus by PCR NEGATIVE NEGATIVE Final    Comment: (NOTE) Fact Sheet for Patients: BloggerCourse.com  Fact Sheet for Healthcare Providers: SeriousBroker.it  This test is not yet approved or cleared by the [place] FDA and has been authorized for detection and/or diagnosis of SARS-CoV-2 by FDA under an Emergency Use Authorization (EUA). This EUA will remain in effect (meaning this test can be used) for the duration of the COVID-19 declaration under Section 564(b)(1) of the Act, 21 U.S.C. section 360bbb-3(b)(1), unless the authorization is terminated or revoked.  Performed at [facility], [address] [place], [place] [address]     Labs: CBC: Recent Labs  Lab 10/22/22 2015 10/23/22 0740  WBC 12.5* 8.4  NEUTROABS 8.2*  --   HGB 13.7 11.7*  HCT 39.1 34.1*  MCV 84.6 85.7  PLT 261 217   Basic Metabolic Panel: Recent Labs  Lab 10/22/22 2015 10/23/22 0740  NA 134*   --   K 3.2*  --   CL 103  --   CO2 23  --   GLUCOSE 103*  --   BUN 8  --   CREATININE 1.00 0.82  CALCIUM 9.1  --    Liver Function Tests: Recent Labs  Lab 10/22/22 2015  AST 40  ALT 30  ALKPHOS 51  BILITOT 1.4*  PROT 8.2*  ALBUMIN 4.4   CBG: No results for input(s): "GLUCAP" in the last 168 hours.  Discharge time spent: greater than 30 minutes.  Signed: Marcelino Duster, MD [facility] 10/25/2022

## 2022-10-29 ENCOUNTER — Emergency Department
Admission: EM | Admit: 2022-10-29 | Discharge: 2022-10-29 | Disposition: A | Discharge status: Home / Self Care | Payer: Medicaid Other | Attending: Emergency Medicine | Admitting: Emergency Medicine

## 2022-10-29 ENCOUNTER — Other Ambulatory Visit: Payer: Self-pay

## 2022-10-29 ENCOUNTER — Encounter: Payer: Self-pay | Admitting: Emergency Medicine

## 2022-10-29 DIAGNOSIS — T361X5A Adverse effect of cephalosporins and other beta-lactam antibiotics, initial encounter: Secondary | ICD-10-CM | POA: Insufficient documentation

## 2022-10-29 DIAGNOSIS — R220 Localized swelling, mass and lump, head: Secondary | ICD-10-CM | POA: Diagnosis present

## 2022-10-29 DIAGNOSIS — T7840XA Allergy, unspecified, initial encounter: Secondary | ICD-10-CM

## 2022-10-29 MED ORDER — SULFAMETHOXAZOLE-TRIMETHOPRIM 800-160 MG PO TABS: 1.0000 | ORAL_TABLET | Freq: Two times a day (BID) | ORAL | 0 refills | Status: DC

## 2022-10-29 MED ORDER — DIPHENHYDRAMINE HCL 25 MG PO CAPS
25.0000 mg | ORAL_CAPSULE | Freq: Once | ORAL | Status: AC
  Administered 2022-10-29: 25 mg via ORAL
  Filled 2022-10-29: qty 1

## 2022-10-29 MED ORDER — PREDNISONE 10 MG (21) PO TBPK: ORAL_TABLET | ORAL | 0 refills | Status: DC

## 2022-10-29 MED ORDER — PREDNISONE 20 MG PO TABS
60.0000 mg | ORAL_TABLET | Freq: Once | ORAL | Status: AC
  Administered 2022-10-29: 60 mg via ORAL
  Filled 2022-10-29: qty 3

## 2022-10-29 NOTE — Discharge Instructions (Addendum)
Start new antibiotic tomorrow.  Follow up with primary care as advised on discharge.

## 2022-10-29 NOTE — ED Triage Notes (Signed)
Pt presents ambulatory to triage via POV with complaints of an allergic reaction to Cefadroxil tonight. Pt has been taking the medication 3 days and after taking a dose tonight she developed hives on her face and lip swelling. Airway patent and respiratory distress. No meds taken PTA. A&Ox4 at this time. Denies CP or SOB.

## 2022-10-29 NOTE — ED Provider Notes (Signed)
[facility] Provider Note    Event Date/Time   First MD Initiated Contact with Patient 10/29/22 2017     (approximate)   History   Allergic Reaction   HPI  Gwendolyn Garrett is a 22 y.o. female  with history of UTI and as listed in EMR presents to the emergency department for evaluation after onset of an allergic reaction to cefadroxil.  She had been treated for urosepsis and discharged home yesterday.  She was on day 3 of the medication and developed hives on her face with swelling to her lips and under her eyes.  She is also had watery discharge from her eyes and runny nose.  She has no difficulty breathing or feeling of airway swelling but her throat had been itchy.  Symptoms are improving.  She still has some swelling to her lip, the runny nose and puffiness under her eyes.  She did not take any medications prior to coming to the emergency department..      Physical Exam   Triage Vital Signs: ED Triage Vitals  Encounter Vitals Group     BP 10/29/22 1926 (!) 132/94     Systolic BP Percentile --      Diastolic BP Percentile --      Pulse Rate 10/29/22 1926 79     Resp 10/29/22 1926 18     Temp 10/29/22 1926 98 F (36.7 C)     Temp src --      SpO2 10/29/22 1926 100 %     Weight 10/29/22 1924 213 lb 0.1 oz (96.6 kg)     Height 10/29/22 1924 6' (1.829 m)     Head Circumference --      Peak Flow --      Pain Score 10/29/22 1926 0     Pain Loc --      Pain Education --      Exclude from Growth Chart --     Most recent vital signs: Vitals:   10/29/22 1926  BP: (!) 132/94  Pulse: 79  Resp: 18  Temp: 98 F (36.7 C)  SpO2: 100%    General: Awake, no distress.  CV:  Good peripheral perfusion.  Resp:  Normal effort.  Clear to auscultation Abd:  No distention.  Other:  No hives noted, puffiness under both eyes with watery discharge.   ED Results / Procedures / Treatments   Labs (all labs ordered are listed, but only abnormal results  are displayed) Labs Reviewed - No data to display   EKG  Not indicated.   RADIOLOGY  Image and radiology report reviewed and interpreted by me. Radiology report consistent with the same.  Not indicated.  PROCEDURES:  Critical Care performed: No  Procedures   MEDICATIONS ORDERED IN ED:  Medications  predniSONE (DELTASONE) tablet 60 mg (60 mg Oral Given 10/29/22 2043)  diphenhydrAMINE (BENADRYL) capsule 25 mg (25 mg Oral Given 10/29/22 2043)     IMPRESSION / MDM / ASSESSMENT AND PLAN / ED COURSE   I have reviewed the triage note.  Differential diagnosis includes, but is not limited to, allergic reaction, angioedema  Patient's presentation is most consistent with acute illness / injury with system symptoms.  22 year old female presenting to the emergency department for treatment and evaluation after taking her antibiotic tonight.  She had been treated upstairs for urosepsis and is on cefadroxil.  She also has a penicillin allergy.  Plan will be to give her prednisone and Benadryl then change  her antibiotic.  She is aware and agreeable to the plan.  Review of urine culture shows it is sensitive to Bactrim. Rx submitted to pharmacy in addition to prednisone taper. She will start it tomorrow.     FINAL CLINICAL IMPRESSION(S) / ED DIAGNOSES   Final diagnoses:  Allergic reaction, initial encounter     Rx / DC Orders   ED Discharge Orders          Ordered    sulfamethoxazole-trimethoprim (BACTRIM DS) 800-160 MG tablet  2 times daily        10/29/22 2140    predniSONE (STERAPRED UNI-PAK 21 TAB) 10 MG (21) TBPK tablet        10/29/22 2143             Note:  This document was prepared using Dragon voice recognition software and may include unintentional dictation errors.   Chinita Pester, FNP 10/29/22 2243    Jene Every, MD 10/30/22 [phone]

## 2023-05-10 ENCOUNTER — Ambulatory Visit (INDEPENDENT_AMBULATORY_CARE_PROVIDER_SITE_OTHER): Payer: Medicaid Other

## 2023-05-10 VITALS — BP 132/84 | HR 93

## 2023-05-10 DIAGNOSIS — Z3200 Encounter for pregnancy test, result unknown: Secondary | ICD-10-CM

## 2023-05-10 DIAGNOSIS — N912 Amenorrhea, unspecified: Secondary | ICD-10-CM

## 2023-05-10 DIAGNOSIS — Z3201 Encounter for pregnancy test, result positive: Secondary | ICD-10-CM | POA: Diagnosis not present

## 2023-05-10 LAB — POCT URINE PREGNANCY: Preg Test, Ur: POSITIVE — AB

## 2023-05-10 NOTE — Progress Notes (Signed)
    NURSE VISIT NOTE  Subjective:    Patient ID: Gwendolyn Garrett, female    DOB: Oct 31, 2000, 23 y.o.   MRN: [id number]  HPI  Patient is a 23 y.o. G23P1001 female who presents for evaluation of amenorrhea. She believes she could be pregnant. Pregnancy is not desired. Sexual Activity: single partner, contraception: none. Current symptoms also include: breast tenderness and positive home pregnancy test. Last period was normal.    Objective:    BP 132/84 (BP Location: Left Arm, Patient Position: Sitting, Cuff Size: Normal)   Pulse 93   Lab Review  Results for orders placed or performed in visit on 05/10/23  POCT urine pregnancy  Result Value Ref Range   Preg Test, Ur Positive (A) Negative    Assessment:   1. Possible pregnancy     Plan:   Pregnancy Test: Positive    At this time the patient doesn't plan to keep the baby. List of places to go to terminate baby was given. Was advised to let us  know if she plans to keep baby. GA: [redacted]w[redacted]d EDD: 9/24/22025   Burnard LITTIE Ro, CMA

## 2023-05-15 ENCOUNTER — Telehealth: Payer: Self-pay

## 2023-05-15 ENCOUNTER — Emergency Department
Admission: EM | Admit: 2023-05-15 | Discharge: 2023-05-15 | Disposition: A | Discharge status: Home / Self Care | Payer: Medicaid Other | Attending: Emergency Medicine | Admitting: Emergency Medicine

## 2023-05-15 ENCOUNTER — Emergency Department: Payer: Medicaid Other

## 2023-05-15 ENCOUNTER — Other Ambulatory Visit: Payer: Self-pay

## 2023-05-15 DIAGNOSIS — O209 Hemorrhage in early pregnancy, unspecified: Secondary | ICD-10-CM | POA: Insufficient documentation

## 2023-05-15 DIAGNOSIS — Z3A01 Less than 8 weeks gestation of pregnancy: Secondary | ICD-10-CM | POA: Diagnosis not present

## 2023-05-15 DIAGNOSIS — O4690 Antepartum hemorrhage, unspecified, unspecified trimester: Secondary | ICD-10-CM

## 2023-05-15 LAB — URINALYSIS, W/ REFLEX TO CULTURE (INFECTION SUSPECTED)
Bilirubin Urine: NEGATIVE
Glucose, UA: NEGATIVE mg/dL
Ketones, ur: NEGATIVE mg/dL
Leukocytes,Ua: NEGATIVE
Nitrite: NEGATIVE
Protein, ur: 100 mg/dL — AB
Specific Gravity, Urine: 1.004 — ABNORMAL LOW (ref 1.005–1.030)
pH: 8 (ref 5.0–8.0)

## 2023-05-15 LAB — HCG, QUANTITATIVE, PREGNANCY: hCG, Beta Chain, Quant, S: 76 m[IU]/mL — ABNORMAL HIGH (ref <5)

## 2023-05-15 LAB — CBC
HCT: 36.3 % (ref 36.0–46.0)
Hemoglobin: 12.3 g/dL (ref 12.0–15.0)
MCH: 29.7 pg (ref 26.0–34.0)
MCHC: 33.9 g/dL (ref 30.0–36.0)
MCV: 87.7 fL (ref 80.0–100.0)
Platelets: 270 10*3/uL (ref 150–400)
RBC: 4.14 MIL/uL (ref 3.87–5.11)
RDW: 12.3 % (ref 11.5–15.5)
WBC: 6.2 10*3/uL (ref 4.0–10.5)
nRBC: 0 % (ref 0.0–0.2)

## 2023-05-15 LAB — POC URINE PREG, ED: Preg Test, Ur: NEGATIVE

## 2023-05-15 NOTE — ED Triage Notes (Addendum)
Pt comes with c/o vaginal bleeding that started Saturday. Pt states about [redacted] weeks pregnant. Pt states some cramping at first. Pt states it has been ongoing and it is heavy.

## 2023-05-15 NOTE — Discharge Instructions (Addendum)
Based on your beta HCG and findings on your ultrasound I believe you are having a miscarriage. It is very important that you have your beta HCG levels checked again in 48 hours. This can be done by your OB or you can return to the ED.   You can take tylenol as needed for abdominal pain.   Return to the ED with any new or worsening symptoms.   Results of your ultrasound are below:  No intrauterine gestational sac, yolk sac, or fetal pole  identified. Differential considerations include intrauterine  pregnancy too early to be sonographically visualized, missed  abortion, or ectopic pregnancy. Followup ultrasound is recommended  in 10-14 days for further evaluation.

## 2023-05-15 NOTE — Telephone Encounter (Signed)
Chart reviewed. Patient currently being seen at ED.

## 2023-05-15 NOTE — ED Provider Notes (Signed)
[facility] Provider Note    Event Date/Time   First MD Initiated Contact with Patient 05/15/23 1531     (approximate)   History   Vaginal Bleeding   HPI  Lakshmi Sundeen is a 23 y.o. female G3 P1001, who presents for evaluation of vaginal bleeding while approximately [redacted] weeks pregnant.  Patient states she was seen by her OB on Friday and was determined to be 7 weeks 2 days pregnant.  Then on Saturday she began to have some vaginal bleeding and cramping.  Patient states she has been bleeding a lot and passed a few clots.  Patient does not plan on keeping the baby at this time.      Physical Exam   Triage Vital Signs: ED Triage Vitals  Encounter Vitals Group     BP 05/15/23 1456 (!) 131/92     Systolic BP Percentile --      Diastolic BP Percentile --      Pulse Rate 05/15/23 1456 71     Resp 05/15/23 1456 18     Temp 05/15/23 1456 98 F (36.7 C)     Temp src --      SpO2 05/15/23 1456 100 %     Weight 05/15/23 1456 197 lb (89.4 kg)     Height 05/15/23 1456 5\' 10"  (1.778 m)     Head Circumference --      Peak Flow --      Pain Score 05/15/23 1454 3     Pain Loc --      Pain Education --      Exclude from Growth Chart --     Most recent vital signs: Vitals:   05/15/23 1456  BP: (!) 131/92  Pulse: 71  Resp: 18  Temp: 98 F (36.7 C)  SpO2: 100%   General: Awake, no distress.  CV:  Good peripheral perfusion.  RRR. Resp:  Normal effort.  CTAB. Abd:  No distention.  Soft, tenderness to palpation suprapubically.   ED Results / Procedures / Treatments   Labs (all labs ordered are listed, but only abnormal results are displayed) Labs Reviewed  HCG, QUANTITATIVE, PREGNANCY - Abnormal; Notable for the following components:      Result Value   hCG, Beta Chain, Quant, S 76 (*)    All other components within normal limits  URINALYSIS, W/ REFLEX TO CULTURE (INFECTION SUSPECTED) - Abnormal; Notable for the following components:   Color,  Urine AMBER (*)    APPearance HAZY (*)    Specific Gravity, Urine 1.004 (*)    Hgb urine dipstick LARGE (*)    Protein, ur 100 (*)    Bacteria, UA RARE (*)    All other components within normal limits  CBC  POC URINE PREG, ED    RADIOLOGY  Pelvic ultrasound obtained to rule out ectopic pregnancy.  I interpreted the images as well as reviewed the radiologist report.  No intrauterine gestational sac, yolk sac or fetal pole identified.    PROCEDURES:  Critical Care performed: No  Procedures   MEDICATIONS ORDERED IN ED: Medications - No data to display   IMPRESSION / MDM / ASSESSMENT AND PLAN / ED COURSE  I reviewed the triage vital signs and the nursing notes.                             23 year old female presents for evaluation of vaginal bleeding while pregnant.  Vital  signs are stable aside from a slightly elevated blood pressure.  Patient NAD and nontoxic-appearing on exam.  Differential diagnosis includes, but is not limited to, ectopic pregnancy, threatened miscarriage, subchorionic hematoma, implantation bleeding, cervicitis.  Patient's presentation is most consistent with acute complicated illness / injury requiring diagnostic workup.  CBC unremarkable, not concerning for anemia.  Urinalysis shows hemoglobin, protein and rare bacteria.  Beta-hCG is 76.  Based on patient's history of bleeding and the beta being as low as it is, I suspect that she is having a miscarriage.  I will however get an ultrasound to look for other pelvic pathology.  There was no intrauterine gestational sac, yolk sac or fetal pole identified on the transvaginal ultrasound.  I suspect this is because patient is having a miscarriage rather than a pregnancy of unknown location.  Patient was informed that she will need to have a repeat beta obtained in 48 hours.  I emphasized the importance of this.  Patient voiced understanding, all questions were answered and she was stable at discharge.      FINAL CLINICAL IMPRESSION(S) / ED DIAGNOSES   Final diagnoses:  Vaginal bleeding in pregnancy     Rx / DC Orders   ED Discharge Orders     None        Note:  This document was prepared using Dragon voice recognition software and may include unintentional dictation errors.   Cameron Ali, PA-C 05/15/23 1931    Concha Se, MD 05/15/23 2201

## 2023-08-29 ENCOUNTER — Ambulatory Visit
Admission: EM | Admit: 2023-08-29 | Discharge: 2023-08-29 | Disposition: A | Discharge status: Home / Self Care | Attending: Emergency Medicine | Admitting: Emergency Medicine

## 2023-08-29 DIAGNOSIS — Z113 Encounter for screening for infections with a predominantly sexual mode of transmission: Secondary | ICD-10-CM

## 2023-08-29 DIAGNOSIS — Z3202 Encounter for pregnancy test, result negative: Secondary | ICD-10-CM | POA: Diagnosis not present

## 2023-08-29 LAB — PREGNANCY, URINE: Preg Test, Ur: NEGATIVE

## 2023-08-29 LAB — HIV ANTIBODY (ROUTINE TESTING W REFLEX): HIV Screen 4th Generation wRfx: NONREACTIVE

## 2023-08-29 NOTE — ED Triage Notes (Signed)
 Pt presents to UC for STD testing. Denies any active sx's.

## 2023-08-29 NOTE — ED Provider Notes (Signed)
 [facility]    CSN: [id number] Arrival date & time: 08/29/23  0865      History   Chief Complaint Chief Complaint  Patient presents with   SEXUALLY TRANSMITTED DISEASE    HPI Gwendolyn Garrett is a 23 y.o. female.   23 year old female, Gwendolyn Garrett, presents to urgent care for evaluation of possible STI exposure. Pt denies any symptoms put states after her and her fiance have intercourse "it smells fishy". Pt denies discharge or new partner.  Pt requesting pregnancy test since LMP was 08/08/23 as well as RPR/HIV testing.  The history is provided by the patient. No language interpreter was used.    History reviewed. No pertinent past medical history.  Patient Active Problem List   Diagnosis Date Noted   Urine pregnancy test negative 08/29/2023   Hypokalemia 10/23/2022   Sepsis secondary to UTI (HCC) 10/23/2022   Supervision of other normal pregnancy, antepartum 09/11/2021   Healthy adult on routine physical examination 06/20/2020   Routine screening for STI (sexually transmitted infection) 05/07/2020    Past Surgical History:  Procedure Laterality Date   NO PAST SURGERIES     WISDOM TOOTH EXTRACTION     all four; age 69    OB History     Gravida  3   Para  1   Term  1   Preterm      AB      Living  1      SAB      IAB      Ectopic      Multiple  0   Live Births  1            Home Medications    Prior to Admission medications   Medication Sig Start Date End Date Taking? Authorizing Provider  acetaminophen  (TYLENOL ) 325 MG tablet Take 2 tablets (650 mg total) by mouth every 6 (six) hours as needed for fever or moderate pain (or Fever >/= 101). Patient not taking: Reported on 05/10/2023 10/25/22   Aisha Hove, MD  predniSONE  (STERAPRED UNI-PAK 21 TAB) 10 MG (21) TBPK tablet Take 6 tablets on the first day and decrease by 1 tablet each day until finished. Patient not taking: Reported on 05/10/2023 10/29/22   Chrystie Crass B, FNP  sulfamethoxazole -trimethoprim  (BACTRIM  DS) 800-160 MG tablet Take 1 tablet by mouth 2 (two) times daily. Patient not taking: Reported on 05/10/2023 10/29/22   Sherryle Don, FNP    Family History History reviewed. No pertinent family history.  Social History Social History   Tobacco Use   Smoking status: Never   Smokeless tobacco: Never  Vaping Use   Vaping status: Never Used  Substance Use Topics   Alcohol use: No   Drug use: Not Currently     Allergies   Penicillins   Review of Systems Review of Systems  Constitutional:  Negative for fever.  Gastrointestinal:  Negative for abdominal pain.  Genitourinary:  Negative for dysuria and vaginal discharge.  Musculoskeletal:  Negative for back pain.  All other systems reviewed and are negative.    Physical Exam Triage Vital Signs ED Triage Vitals  Encounter Vitals Group     BP 08/29/23 0837 125/81     Systolic BP Percentile --      Diastolic BP Percentile --      Pulse Rate 08/29/23 0837 76     Resp 08/29/23 0837 16     Temp 08/29/23 0837 98.3 F (36.8 C)  Temp Source 08/29/23 0837 Oral     SpO2 08/29/23 0837 99 %     Weight 08/29/23 0836 200 lb (90.7 kg)     Height 08/29/23 0836 5\' 10"  (1.778 m)     Head Circumference --      Peak Flow --      Pain Score 08/29/23 0845 0     Pain Loc --      Pain Education --      Exclude from Growth Chart --    No data found.  Updated Vital Signs BP 125/81 (BP Location: Left Arm)   Pulse 76   Temp 98.3 F (36.8 C) (Oral)   Resp 16   Ht 5\' 10"  (1.778 m)   Wt 200 lb (90.7 kg)   LMP 08/08/2023 (Approximate)   SpO2 99%   Breastfeeding Unknown   BMI 28.70 kg/m   Visual Acuity Right Eye Distance:   Left Eye Distance:   Bilateral Distance:    Right Eye Near:   Left Eye Near:    Bilateral Near:     Physical Exam Vitals and nursing note reviewed.  Constitutional:      General: She is not in acute distress.    Appearance: She is well-developed and  well-groomed.  HENT:     Head: Normocephalic and atraumatic.  Eyes:     Conjunctiva/sclera: Conjunctivae normal.  Cardiovascular:     Rate and Rhythm: Normal rate and regular rhythm.     Heart sounds: No murmur heard. Pulmonary:     Effort: Pulmonary effort is normal. No respiratory distress.     Breath sounds: Normal breath sounds and air entry.  Abdominal:     Palpations: Abdomen is soft.     Tenderness: There is no abdominal tenderness.  Musculoskeletal:        General: No swelling.     Cervical back: Neck supple.  Skin:    General: Skin is warm and dry.     Capillary Refill: Capillary refill takes less than 2 seconds.  Neurological:     General: No focal deficit present.     Mental Status: She is alert and oriented to person, place, and time.     GCS: GCS eye subscore is 4. GCS verbal subscore is 5. GCS motor subscore is 6.  Psychiatric:        Attention and Perception: Attention normal.        Mood and Affect: Mood normal.        Speech: Speech normal.        Behavior: Behavior normal. Behavior is cooperative.      UC Treatments / Results  Labs (all labs ordered are listed, but only abnormal results are displayed) Labs Reviewed  PREGNANCY, URINE  HIV ANTIBODY (ROUTINE TESTING W REFLEX)  RPR  CERVICOVAGINAL ANCILLARY ONLY    EKG   Radiology No results found.  Procedures Procedures (including critical care time)  Medications Ordered in UC Medications - No data to display  Initial Impression / Assessment and Plan / UC Course  I have reviewed the triage vital signs and the nursing notes.  Pertinent labs & imaging results that were available during my care of the patient were reviewed by me and considered in my medical decision making (see chart for details).  Clinical Course as of 08/29/23 1024  Thu Aug 29, 2023  0905 Pt requesting STI testing with blood work for HIV,syphillis; preg test ordered [JD]    Clinical Course User Index [JD] Layton Tappan,  Eveleen Hinds, NP   Discussed exam findings and plan of care with patient, strict go to ER precautions given.   Patient verbalized understanding to this provider.  Ddx: STI, BV,yeast, Urine pregnancy test negative Final Clinical Impressions(s) / UC Diagnoses   Final diagnoses:  Routine screening for STI (sexually transmitted infection)  Urine pregnancy test negative     Discharge Instructions       Check my chart for results. Avoid sexual activity until results,treatment known and completed. Safe sex with all future sexual activity. We have sent testing for sexually transmitted infections. We will notify you of any positive results once they are received. If required, we will prescribe any medications you might need.   Follow up with PCP. Return as needed.  Avoid baths, hot tubs and whirlpool spas.  Don't use scented or harsh soaps, such as those with deodorant or antibacterial action. Avoid irritants. These include scented tampons and pads. Wipe from front to back after using the toilet.  Don't douche. Your vagina doesn't require cleansing other than normal bathing.  Use a  condom. Wear cotton underwear, this fabric helps absorb moisture    ED Prescriptions   None    PDMP not reviewed this encounter.   Peter Brands, NP 08/29/23 1024

## 2023-08-29 NOTE — Discharge Instructions (Signed)
  Check my chart for results. Avoid sexual activity until results,treatment known and completed. Safe sex with all future sexual activity. We have sent testing for sexually transmitted infections. We will notify you of any positive results once they are received. If required, we will prescribe any medications you might need.   Follow up with PCP. Return as needed.  Avoid baths, hot tubs and whirlpool spas.  Don't use scented or harsh soaps, such as those with deodorant or antibacterial action. Avoid irritants. These include scented tampons and pads. Wipe from front to back after using the toilet.  Don't douche. Your vagina doesn't require cleansing other than normal bathing.  Use a  condom. Wear cotton underwear, this fabric helps absorb moisture

## 2023-08-30 LAB — CERVICOVAGINAL ANCILLARY ONLY
Bacterial Vaginitis (gardnerella): POSITIVE — AB
Candida Glabrata: NEGATIVE
Candida Vaginitis: NEGATIVE
Chlamydia: NEGATIVE
Comment: NEGATIVE
Comment: NEGATIVE
Comment: NEGATIVE
Comment: NEGATIVE
Comment: NEGATIVE
Comment: NORMAL
Neisseria Gonorrhea: NEGATIVE
Trichomonas: NEGATIVE

## 2023-08-30 LAB — RPR: RPR Ser Ql: NONREACTIVE

## 2023-08-31 ENCOUNTER — Ambulatory Visit (HOSPITAL_COMMUNITY): Payer: Self-pay

## 2023-08-31 MED ORDER — METRONIDAZOLE 500 MG PO TABS: 500.0000 mg | ORAL_TABLET | Freq: Two times a day (BID) | ORAL | 0 refills | Status: AC

## 2023-09-23 ENCOUNTER — Ambulatory Visit (INDEPENDENT_AMBULATORY_CARE_PROVIDER_SITE_OTHER)

## 2023-09-23 ENCOUNTER — Other Ambulatory Visit (HOSPITAL_COMMUNITY)
Admission: RE | Admit: 2023-09-23 | Discharge: 2023-09-23 | Disposition: A | Discharge status: Home / Self Care | Source: Ambulatory Visit | Attending: Certified Nurse Midwife | Admitting: Certified Nurse Midwife

## 2023-09-23 VITALS — BP 109/69 | HR 82 | Ht 70.0 in | Wt 221.4 lb

## 2023-09-23 DIAGNOSIS — N898 Other specified noninflammatory disorders of vagina: Secondary | ICD-10-CM | POA: Diagnosis present

## 2023-09-23 DIAGNOSIS — N912 Amenorrhea, unspecified: Secondary | ICD-10-CM

## 2023-09-23 DIAGNOSIS — Z3200 Encounter for pregnancy test, result unknown: Secondary | ICD-10-CM

## 2023-09-23 DIAGNOSIS — O3680X0 Pregnancy with inconclusive fetal viability, not applicable or unspecified: Secondary | ICD-10-CM

## 2023-09-23 DIAGNOSIS — Z3201 Encounter for pregnancy test, result positive: Secondary | ICD-10-CM

## 2023-09-23 LAB — POCT URINE PREGNANCY: Preg Test, Ur: POSITIVE — AB

## 2023-09-23 NOTE — Patient Instructions (Signed)
 Prenatal Classes offered through [facility] [email or web address]   Doula Website [email or web address]  First Trimester of Pregnancy  The first trimester of pregnancy starts on the first day of your last monthly period until the end of week 13. This is months 1 through 3 of pregnancy. A week after a sperm fertilizes an egg, the egg will implant into the wall of the uterus and begin to develop into a baby. Body changes during your first trimester Your body goes through many changes during pregnancy. The changes usually return to normal after your baby is born. Physical changes Your breasts may grow larger and may hurt. The area around your nipples may get darker. Your periods will stop. Your hair and nails may grow faster. You may pee more often. Health changes You may tire easily. Your gums may bleed and may be sensitive when you brush and floss. You may not feel hungry. You may have heartburn. You may throw up or feel like you may throw up. You may want to eat some foods, but not others. You may have headaches. You may have trouble pooping (constipation). Other changes Your emotions may change from day to day. You may have more dreams. Follow these instructions at home: Medicines Talk to your health care provider if you're taking medicines. Ask if the medicines are safe to take during pregnancy. Your provider may change the medicines that you take. Do not take any medicines unless told to by your provider. Take a prenatal vitamin that has at least 600 micrograms (mcg) of folic acid. Do not use herbal medicines, illegal substances, or medicines that are not approved by your provider. Eating and drinking While you're pregnant your body needs extra food for your growing baby. Talk with your provider about what to eat while pregnant. Activity Most women are able  to exercise during pregnancy. Exercises may need to change as your pregnancy goes on. Talk to your provider about your activities and exercise routines. Relieving pain and discomfort Wear a good, supportive bra if your breasts hurt. Rest with your legs raised if you have leg cramps or low back pain. Safety Wear your seatbelt at all times when you're in a car. Talk to your provider if someone hits you, hurts you, or yells at you. Talk with your provider if you're feeling sad or have thoughts of hurting yourself. Lifestyle Certain things can be harmful while you're pregnant. Follow these rules: Do not use hot tubs, steam rooms, or saunas. Do not douche. Do not use tampons or scented pads. Do not drink alcohol,smoke, vape, or use products with nicotine or tobacco in them. If you need help quitting, talk with your provider. Avoid cat litter boxes and soil used by cats. These things carry germs that can cause harm to your pregnancy and your baby. General instructions Keep all follow-up visits. It helps you and your unborn baby stay as healthy as possible. Write down your questions. Take them to your visits. Your provider will: Talk with you about your overall health. Give you advice or refer you to specialists who can help with different needs, including: Prenatal education classes. Mental health and counseling. Foods and healthy eating. Ask for help if you need help with food. Call your dentist and ask to be seen. Brush your teeth with a soft toothbrush. Floss gently. Where to find more information American Pregnancy Association: americanpregnancy.org [employer]: acog.org Office on [employer] Health: TravelLesson.ca Contact a health care provider if: You feel dizzy, faint, or  have a fever. You vomit or have watery poop (diarrhea) for 2 days or more. You have abnormal discharge or bleeding from your vagina. You have pain when you pee or your pee smells  bad. You have cramps, pain, or pressure in your belly area. Get help right away if: You have trouble breathing or chest pain. You have any kind of injury, such as from a fall or a car crash. These symptoms may be an emergency. Get help right away. Call 911. Do not wait to see if the symptoms will go away. Do not drive yourself to the hospital. This information is not intended to replace advice given to you by your health care provider. Make sure you discuss any questions you have with your health care provider. Document Revised: 12/20/2022 Document Reviewed: 07/20/2022 Elsevier Patient Education  2024 Elsevier Inc.Commonly Asked Questions During Pregnancy  Cats: A parasite can be excreted in cat feces.  To avoid exposure you need to have another person empty the little box.  If you must empty the litter box you will need to wear gloves.  Wash your hands after handling your cat.  This parasite can also be found in raw or undercooked meat so this should also be avoided.  Colds, Sore Throats, Flu: Please check your medication sheet to see what you can take for symptoms.  If your symptoms are unrelieved by these medications please call the office.  Dental Work: Most any dental work Agricultural consultant recommends is permitted.  X-rays should only be taken during the first trimester if absolutely necessary.  Your abdomen should be shielded with a lead apron during all x-rays.  Please notify your provider prior to receiving any x-rays.  Novocaine is fine; gas is not recommended.  If your dentist requires a note from [place] prior to dental work please call the office and we will provide one for you.  Exercise: Exercise is an important part of staying healthy during your pregnancy.  You may continue most exercises you were accustomed to prior to pregnancy.  Later in your pregnancy you will most likely notice you have difficulty with activities requiring balance like riding a bicycle.  It is important that you listen  to your body and avoid activities that put you at a higher risk of falling.  Adequate rest and staying well hydrated are a must!  If you have questions about the safety of specific activities ask your provider.    Exposure to Children with illness: Try to avoid obvious exposure; report any symptoms to [place] when noted,  If you have chicken pos, red measles or mumps, you should be immune to these diseases.   Please do not take any vaccines while pregnant unless you have checked with your OB provider.  Fetal Movement: After 28 weeks we recommend you do "kick counts" twice daily.  Lie or sit down in a calm quiet environment and count your baby movements "kicks".  You should feel your baby at least 10 times per hour.  If you have not felt 10 kicks within the first hour get up, walk around and have something sweet to eat or drink then repeat for an additional hour.  If count remains less than 10 per hour notify your provider.  Fumigating: Follow your pest control agent's advice as to how long to stay out of your home.  Ventilate the area well before re-entering.  Hemorrhoids:   Most over-the-counter preparations can be used during pregnancy.  Check your medication to see  what is safe to use.  It is important to use a stool softener or fiber in your diet and to drink lots of liquids.  If hemorrhoids seem to be getting worse please call the office.   Hot Tubs:  Hot tubs Jacuzzis and saunas are not recommended while pregnant.  These increase your internal body temperature and should be avoided.  Intercourse:  Sexual intercourse is safe during pregnancy as long as you are comfortable, unless otherwise advised by your provider.  Spotting may occur after intercourse; report any bright red bleeding that is heavier than spotting.  Labor:  If you know that you are in labor, please go to the hospital.  If you are unsure, please call the office and let us help you decide what to do.  Lifting, straining, etc:  If your  job requires heavy lifting or straining please check with your provider for any limitations.  Generally, you should not lift items heavier than that you can lift simply with your hands and arms (no back muscles)  Painting:  Paint fumes do not harm your pregnancy, but may make you ill and should be avoided if possible.  Latex or water based paints have less odor than oils.  Use adequate ventilation while painting.  Permanents & Hair Color:  Chemicals in hair dyes are not recommended as they cause increase hair dryness which can increase hair loss during pregnancy.  " Highlighting" and permanents are allowed.  Dye may be absorbed differently and permanents may not hold as well during pregnancy.  Sunbathing:  Use a sunscreen, as skin burns easily during pregnancy.  Drink plenty of fluids; avoid over heating.  Tanning Beds:  Because their possible side effects are still unknown, tanning beds are not recommended.  Ultrasound Scans:  Routine ultrasounds are performed at approximately 20 weeks.  You will be able to see your baby's general anatomy an if you would like to know the gender this can usually be determined as well.  If it is questionable when you conceived you may also receive an ultrasound early in your pregnancy for dating purposes.  Otherwise ultrasound exams are not routinely performed unless there is a medical necessity.  Although you can request a scan we ask that you pay for it when conducted because insurance does not cover " patient request" scans.  Work: If your pregnancy proceeds without complications you may work until your due date, unless your physician or employer advises otherwise.  Round Ligament Pain/Pelvic Discomfort:  Sharp, shooting pains not associated with bleeding are fairly common, usually occurring in the second trimester of pregnancy.  They tend to be worse when standing up or when you remain standing for long periods of time.  These are the result of pressure of certain  pelvic ligaments called "round ligaments".  Rest, Tylenol and heat seem to be the most effective relief.  As the womb and fetus grow, they rise out of the pelvis and the discomfort improves.  Please notify the office if your pain seems different than that described.  It may represent a more serious condition.  Common Medications Safe in Pregnancy  Acne:      Constipation:  Benzoyl Peroxide     Colace  Clindamycin      Dulcolax Suppository  Topica Erythromycin     Fibercon  Salicylic Acid      Metamucil         Miralax AVOID:        Senakot   Accutane  Cough:  Retin-A       Cough Drops  Tetracycline      Phenergan w/ Codeine if Rx  Minocycline      Robitussin (Plain & DM)  Antibiotics:     Crabs/Lice:  Ceclor       RID  Cephalosporins    AVOID:  E-Mycins      Kwell  Keflex  Macrobid/Macrodantin   Diarrhea:  Penicillin      Kao-Pectate  Zithromax      Imodium AD         PUSH FLUIDS AVOID:       Cipro     Fever:  Tetracycline      Tylenol (Regular or Extra  Minocycline       Strength)  Levaquin      Extra Strength-Do not          Exceed 8 tabs/24 hrs Caffeine:        200mg /day (equiv. To 1 cup of coffee or  approx. 3 12 oz sodas)         Gas: Cold/Hayfever:       Gas-X  Benadryl      Mylicon  Claritin       Phazyme  **Claritin-D        Chlor-Trimeton    Headaches:  Dimetapp      ASA-Free Excedrin  Drixoral-Non-Drowsy     Cold Compress  Mucinex (Guaifenasin)     Tylenol (Regular or Extra  Sudafed/Sudafed-12 Hour     Strength)  **Sudafed PE Pseudoephedrine   Tylenol Cold & Sinus     Vicks Vapor Rub  Zyrtec  **AVOID if Problems With Blood Pressure         Heartburn: Avoid lying down for at least 1 hour after meals  Aciphex      Maalox     Rash:  Milk of Magnesia     Benadryl    Mylanta       1% Hydrocortisone Cream  Pepcid  Pepcid Complete   Sleep Aids:  Prevacid      Ambien   Prilosec       Benadryl  Rolaids       Chamomile Tea  Tums (Limit  4/day)     Unisom         Tylenol PM         Warm milk-add vanilla or  Hemorrhoids:       Sugar for taste  Anusol/Anusol H.C.  (RX: Analapram 2.5%)  Sugar Substitutes:  Hydrocortisone OTC     Ok in moderation  Preparation H      Tucks        Vaseline lotion applied to tissue with wiping    Herpes:     Throat:  Acyclovir      Oragel  Famvir  Valtrex     Vaccines:         Flu Shot Leg Cramps:       *Gardasil  Benadryl      Hepatitis A         Hepatitis B Nasal Spray:       Pneumovax  Saline Nasal Spray     Polio Booster         Tetanus Nausea:       Tuberculosis test or PPD  Vitamin B6 25 mg TID   AVOID:    Dramamine      *Gardasil  Emetrol       Live Poliovirus  Ginger Root 250 mg  QID    MMR (measles, mumps &  High Complex Carbs @ Bedtime    rebella)  Sea Bands-Accupressure    Varicella (Chickenpox)  Unisom 1/2 tab TID     *No known complications           If received before Pain:         Known pregnancy;   Darvocet       Resume series after  Lortab        Delivery  Percocet    Yeast:   Tramadol      Femstat  Tylenol 3      Gyne-lotrimin  Ultram       Monistat  Vicodin           MISC:         All Sunscreens           Hair Coloring/highlights          Insect Repellant's          (Including DEET)         Mystic Tans

## 2023-09-23 NOTE — Progress Notes (Signed)
    NURSE VISIT NOTE  Subjective:    Patient ID: Gwendolyn Garrett, female    DOB: 2001-03-08, 23 y.o.   MRN: [id number]  HPI  Patient is a 23 y.o. G58P1001 female who presents for evaluation of amenorrhea. She believes she could be pregnant. Pregnancy is desired. Sexual Activity: single partner, contraception: none. Current symptoms also include: morning sickness and positive home pregnancy test. Last period was normal.    Objective:    BP 109/69   Pulse 82   Ht 5' 10 (1.778 m)   Wt 221 lb 6.4 oz (100.4 kg)   LMP 08/05/2023 (Approximate)   Breastfeeding No   BMI 31.77 kg/m   Lab Review  Results for orders placed or performed in visit on 09/23/23  POCT urine pregnancy  Result Value Ref Range   Preg Test, Ur Positive (A) Negative    Assessment:   1. Possible pregnancy, not yet confirmed   2. Vaginal irritation   3. Encounter to determine fetal viability of pregnancy, single or unspecified fetus     Plan:   Pregnancy Test: Positive  Estimated Date of Delivery: 05/11/24 BP Cuff Measurement taken. Cuff Size Adult Large Encouraged well-balanced diet, plenty of rest when needed, pre-natal vitamins daily and walking for exercise.  Discussed self-help for nausea, avoiding OTC medications until consulting provider or pharmacist, other than Tylenol  as needed, minimal caffeine (1-2 cups daily) and avoiding alcohol.   She will schedule her nurse visit @ 7-[redacted] wks pregnant, u/s for dating @10  wk, and NOB visit at [redacted] wk pregnant.    Feel free to call with any questions.     Waddell JONELLE Maxim, CMA

## 2023-09-25 LAB — CERVICOVAGINAL ANCILLARY ONLY
Bacterial Vaginitis (gardnerella): NEGATIVE
Candida Glabrata: NEGATIVE
Candida Vaginitis: POSITIVE — AB
Comment: NEGATIVE
Comment: NEGATIVE
Comment: NEGATIVE

## 2023-09-26 ENCOUNTER — Ambulatory Visit: Payer: Self-pay | Admitting: Certified Nurse Midwife

## 2023-09-26 DIAGNOSIS — B3731 Acute candidiasis of vulva and vagina: Secondary | ICD-10-CM

## 2023-09-26 DIAGNOSIS — O219 Vomiting of pregnancy, unspecified: Secondary | ICD-10-CM

## 2023-09-26 MED ORDER — ONDANSETRON 4 MG PO TBDP: 4.0000 mg | ORAL_TABLET | Freq: Three times a day (TID) | ORAL | 0 refills | Status: DC | PRN

## 2023-09-26 MED ORDER — FLUCONAZOLE 150 MG PO TABS: 150.0000 mg | ORAL_TABLET | Freq: Once | ORAL | 0 refills | Status: AC

## 2023-09-27 MED ORDER — FLUCONAZOLE 150 MG PO TABS: 150.0000 mg | ORAL_TABLET | Freq: Once | ORAL | 0 refills | Status: AC

## 2023-09-27 NOTE — Addendum Note (Signed)
 Addended by: KIZZIE CAMELIA CROME on: 09/27/2023 02:13 PM   Modules accepted: Orders

## 2023-09-30 ENCOUNTER — Telehealth: Payer: Self-pay | Admitting: Certified Nurse Midwife

## 2023-09-30 ENCOUNTER — Telehealth

## 2023-09-30 NOTE — Telephone Encounter (Signed)
 Reached out to pt to reschedule NOB Nurse Intake appt that was scheduled on 09/30/2023 at 2:15.  Left message for pt to call back.

## 2023-10-01 ENCOUNTER — Encounter: Payer: Self-pay | Admitting: Certified Nurse Midwife

## 2023-10-01 NOTE — Telephone Encounter (Signed)
 Reached out to pt (2x) to reschedule NOB Nurse Intake appt that was scheduled on 09/30/2023 at 2:15.  Could not leave a message bc mailbox was not set up.  Will send a MyChart letter to the pt.

## 2023-10-11 ENCOUNTER — Telehealth

## 2023-10-11 DIAGNOSIS — O0933 Supervision of pregnancy with insufficient antenatal care, third trimester: Secondary | ICD-10-CM | POA: Insufficient documentation

## 2023-10-11 DIAGNOSIS — Z3480 Encounter for supervision of other normal pregnancy, unspecified trimester: Secondary | ICD-10-CM | POA: Insufficient documentation

## 2023-10-11 DIAGNOSIS — O09299 Supervision of pregnancy with other poor reproductive or obstetric history, unspecified trimester: Secondary | ICD-10-CM | POA: Insufficient documentation

## 2023-10-11 DIAGNOSIS — Z3689 Encounter for other specified antenatal screening: Secondary | ICD-10-CM | POA: Diagnosis not present

## 2023-10-11 NOTE — Patient Instructions (Signed)
 First Trimester of Pregnancy  The first trimester of pregnancy starts on the first day of your last monthly period until the end of week 13. This is months 1 through 3 of pregnancy. A week after a sperm fertilizes an egg, the egg will implant into the wall of the uterus and begin to develop into a baby. Body changes during your first trimester Your body goes through many changes during pregnancy. The changes usually return to normal after your baby is born. Physical changes Your breasts may grow larger and may hurt. The area around your nipples may get darker. Your periods will stop. Your hair and nails may grow faster. You may pee more often. Health changes You may tire easily. Your gums may bleed and may be sensitive when you brush and floss. You may not feel hungry. You may have heartburn. You may throw up or feel like you may throw up. You may want to eat some foods, but not others. You may have headaches. You may have trouble pooping (constipation). Other changes Your emotions may change from day to day. You may have more dreams. Follow these instructions at home: Medicines Talk to your health care provider if you're taking medicines. Ask if the medicines are safe to take during pregnancy. Your provider may change the medicines that you take. Do not take any medicines unless told to by your provider. Take a prenatal vitamin that has at least 600 micrograms (mcg) of folic acid. Do not use herbal medicines, illegal substances, or medicines that are not approved by your provider. Eating and drinking While you're pregnant your body needs extra food for your growing baby. Talk with your provider about what to eat while pregnant. Activity Most women are able to exercise during pregnancy. Exercises may need to change as your pregnancy goes on. Talk to your provider about your activities and exercise routines. Relieving pain and discomfort Wear a good, supportive bra if your breasts  hurt. Rest with your legs raised if you have leg cramps or low back pain. Safety Wear your seatbelt at all times when you're in a car. Talk to your provider if someone hits you, hurts you, or yells at you. Talk with your provider if you're feeling sad or have thoughts of hurting yourself. Lifestyle Certain things can be harmful while you're pregnant. Follow these rules: Do not use hot tubs, steam rooms, or saunas. Do not douche. Do not use tampons or scented pads. Do not drink alcohol,smoke, vape, or use products with nicotine or tobacco in them. If you need help quitting, talk with your provider. Avoid cat litter boxes and soil used by cats. These things carry germs that can cause harm to your pregnancy and your baby. General instructions Keep all follow-up visits. It helps you and your unborn baby stay as healthy as possible. Write down your questions. Take them to your visits. Your provider will: Talk with you about your overall health. Give you advice or refer you to specialists who can help with different needs, including: Prenatal education classes. Mental health and counseling. Foods and healthy eating. Ask for help if you need help with food. Call your dentist and ask to be seen. Brush your teeth with a soft toothbrush. Floss gently. Where to find more information American Pregnancy Association: americanpregnancy.org [employer]: acog.org Office on [employer] Health: [employer] Contact a health care provider if: You feel dizzy, faint, or have a fever. You vomit or have watery poop (diarrhea) for 2  days or more. You have abnormal discharge or bleeding from your vagina. You have pain when you pee or your pee smells bad. You have cramps, pain, or pressure in your belly area. Get help right away if: You have trouble breathing or chest pain. You have any kind of injury, such as from a fall or a car crash. These symptoms may be an  emergency. Get help right away. Call 911. Do not wait to see if the symptoms will go away. Do not drive yourself to the hospital. This information is not intended to replace advice given to you by your health care provider. Make sure you discuss any questions you have with your health care provider. Document Revised: 12/20/2022 Document Reviewed: 07/20/2022 Elsevier Patient Education  2024 Elsevier Inc.   Common Medications Safe in Pregnancy  Acne:      Constipation:  Benzoyl Peroxide     Colace  Clindamycin      Dulcolax Suppository  Topica Erythromycin     Fibercon  Salicylic Acid      Metamucil         Miralax AVOID:        Senakot   Accutane    Cough:  Retin-A       Cough Drops  Tetracycline      Phenergan w/ Codeine if Rx  Minocycline      Robitussin (Plain & DM)  Antibiotics:     Crabs/Lice:  Ceclor       RID  Cephalosporins    AVOID:  E-Mycins      Kwell  Keflex  Macrobid/Macrodantin   Diarrhea:  Penicillin      Kao-Pectate  Zithromax      Imodium AD         PUSH FLUIDS AVOID:       Cipro     Fever:  Tetracycline      Tylenol (Regular or Extra  Minocycline       Strength)  Levaquin      Extra Strength-Do not          Exceed 8 tabs/24 hrs Caffeine:        200mg /day (equiv. To 1 cup of coffee or  approx. 3 12 oz sodas)         Gas: Cold/Hayfever:       Gas-X  Benadryl      Mylicon  Claritin       Phazyme  **Claritin-D        Chlor-Trimeton    Headaches:  Dimetapp      ASA-Free Excedrin  Drixoral-Non-Drowsy     Cold Compress  Mucinex (Guaifenasin)     Tylenol (Regular or Extra  Sudafed/Sudafed-12 Hour     Strength)  **Sudafed PE Pseudoephedrine   Tylenol Cold & Sinus     Vicks Vapor Rub  Zyrtec  **AVOID if Problems With Blood Pressure         Heartburn: Avoid lying down for at least 1 hour after meals  Aciphex      Maalox     Rash:  Milk of Magnesia     Benadryl    Mylanta       1% Hydrocortisone Cream  Pepcid  Pepcid Complete   Sleep  Aids:  Prevacid      Ambien   Prilosec       Benadryl  Rolaids       Chamomile Tea  Tums (Limit 4/day)     Unisom  Tylenol PM         Warm milk-add vanilla or  Hemorrhoids:       Sugar for taste  Anusol/Anusol H.C.  (RX: Analapram 2.5%)  Sugar Substitutes:  Hydrocortisone OTC     Ok in moderation  Preparation H      Tucks        Vaseline lotion applied to tissue with wiping    Herpes:     Throat:  Acyclovir      Oragel  Famvir  Valtrex     Vaccines:         Flu Shot Leg Cramps:       *Gardasil  Benadryl      Hepatitis A         Hepatitis B Nasal Spray:       Pneumovax  Saline Nasal Spray     Polio Booster         Tetanus Nausea:       Tuberculosis test or PPD  Vitamin B6 25 mg TID   AVOID:    Dramamine      *Gardasil  Emetrol       Live Poliovirus  Ginger Root 250 mg QID    MMR (measles, mumps &  High Complex Carbs @ Bedtime    rebella)  Sea Bands-Accupressure    Varicella (Chickenpox)  Unisom 1/2 tab TID     *No known complications           If received before Pain:         Known pregnancy;   Darvocet       Resume series after  Lortab        Delivery  Percocet    Yeast:   Tramadol      Femstat  Tylenol 3      Gyne-lotrimin  Ultram       Monistat  Vicodin           MISC:         All Sunscreens           Hair Coloring/highlights          Insect Repellant's          (Including DEET)         Mystic Tans   Commonly Asked Questions During Pregnancy   Cats: A parasite can be excreted in cat feces.  To avoid exposure you need to have another person empty the little box.  If you must empty the litter box you will need to wear gloves.  Wash your hands after handling your cat.  This parasite can also be found in raw or undercooked meat so this should also be avoided.  Colds, Sore Throats, Flu: Please check your medication sheet to see what you can take for symptoms.  If your symptoms are unrelieved by these medications please call the office.  Dental Work: Most  any dental work Agricultural consultant recommends is permitted.  X-rays should only be taken during the first trimester if absolutely necessary.  Your abdomen should be shielded with a lead apron during all x-rays.  Please notify your provider prior to receiving any x-rays.  Novocaine is fine; gas is not recommended.  If your dentist requires a note from [place] prior to dental work please call the office and we will provide one for you.  Exercise: Exercise is an important part of staying healthy during your pregnancy.  You may continue most exercises you were accustomed to prior to pregnancy.  Later in your pregnancy you will most likely notice you have difficulty with activities requiring balance like riding a bicycle.  It is important that you listen to your body and avoid activities that put you at a higher risk of falling.  Adequate rest and staying well hydrated are a must!  If you have questions about the safety of specific activities ask your provider.    Exposure to Children with illness: Try to avoid obvious exposure; report any symptoms to [place] when noted,  If you have chicken pos, red measles or mumps, you should be immune to these diseases.   Please do not take any vaccines while pregnant unless you have checked with your OB provider.  Fetal Movement: After 28 weeks we recommend you do "kick counts" twice daily.  Lie or sit down in a calm quiet environment and count your baby movements "kicks".  You should feel your baby at least 10 times per hour.  If you have not felt 10 kicks within the first hour get up, walk around and have something sweet to eat or drink then repeat for an additional hour.  If count remains less than 10 per hour notify your provider.  Fumigating: Follow your pest control agent's advice as to how long to stay out of your home.  Ventilate the area well before re-entering.  Hemorrhoids:   Most over-the-counter preparations can be used during pregnancy.  Check your medication to see what is  safe to use.  It is important to use a stool softener or fiber in your diet and to drink lots of liquids.  If hemorrhoids seem to be getting worse please call the office.   Hot Tubs:  Hot tubs Jacuzzis and saunas are not recommended while pregnant.  These increase your internal body temperature and should be avoided.  Intercourse:  Sexual intercourse is safe during pregnancy as long as you are comfortable, unless otherwise advised by your provider.  Spotting may occur after intercourse; report any bright red bleeding that is heavier than spotting.  Labor:  If you know that you are in labor, please go to the hospital.  If you are unsure, please call the office and let us help you decide what to do.  Lifting, straining, etc:  If your job requires heavy lifting or straining please check with your provider for any limitations.  Generally, you should not lift items heavier than that you can lift simply with your hands and arms (no back muscles)  Painting:  Paint fumes do not harm your pregnancy, but may make you ill and should be avoided if possible.  Latex or water based paints have less odor than oils.  Use adequate ventilation while painting.  Permanents & Hair Color:  Chemicals in hair dyes are not recommended as they cause increase hair dryness which can increase hair loss during pregnancy.  " Highlighting" and permanents are allowed.  Dye may be absorbed differently and permanents may not hold as well during pregnancy.  Sunbathing:  Use a sunscreen, as skin burns easily during pregnancy.  Drink plenty of fluids; avoid over heating.  Tanning Beds:  Because their possible side effects are still unknown, tanning beds are not recommended.  Ultrasound Scans:  Routine ultrasounds are performed at approximately 20 weeks.  You will be able to see your baby's general anatomy an if you would like to know the gender this can usually be determined as well.  If it is questionable when you conceived you may  also  receive an ultrasound early in your pregnancy for dating purposes.  Otherwise ultrasound exams are not routinely performed unless there is a medical necessity.  Although you can request a scan we ask that you pay for it when conducted because insurance does not cover " patient request" scans.  Work: If your pregnancy proceeds without complications you may work until your due date, unless your physician or employer advises otherwise.  Round Ligament Pain/Pelvic Discomfort:  Sharp, shooting pains not associated with bleeding are fairly common, usually occurring in the second trimester of pregnancy.  They tend to be worse when standing up or when you remain standing for long periods of time.  These are the result of pressure of certain pelvic ligaments called "round ligaments".  Rest, Tylenol and heat seem to be the most effective relief.  As the womb and fetus grow, they rise out of the pelvis and the discomfort improves.  Please notify the office if your pain seems different than that described.  It may represent a more serious condition.

## 2023-10-11 NOTE — Progress Notes (Signed)
 New OB Intake  I connected with  Gwendolyn Garrett on 10/11/23 at  8:15 AM EDT by MyChart Video Visit and verified that I am speaking with the correct person using two identifiers. Nurse is located at [facility] and pt is located at home.  I discussed the limitations, risks, security and privacy concerns of performing an evaluation and management service by telephone and the availability of in person appointments. I also discussed with the patient that there may be a patient responsible charge related to this service. The patient expressed understanding and agreed to proceed.  I explained I am completing New OB Intake today. We discussed her EDD of 05/11/24 that is based on LMP of 08/05/23. Pt is G4/P1. I reviewed her allergies, medications, Medical/Surgical/OB history, and appropriate screenings. There are cats in the home: no. Based on history, this is a/an pregnancy uncomplicated . Her obstetrical history is significant for N/A.  Patient Active Problem List   Diagnosis Date Noted   Supervision of other normal pregnancy, antepartum 10/11/2023   History of miscarriage, currently pregnant 10/11/2023   Hypokalemia 10/23/2022   Sepsis secondary to UTI [facility]) 10/23/2022    Concerns addressed today: None  Delivery Plans:  Plans to deliver at [facility].  Anatomy US  Explained first scheduled US  will be 10/14/23. Anatomy US  will be scheduled around [redacted] weeks gestational age.  Labs Discussed genetic screening with patient. Patient desires genetic testing to be drawn at new OB visit. Discussed possible labs to be drawn at new OB appointment.  COVID Vaccine Patient has not had COVID vaccine.   Social Determinants of Health Food Insecurity: denies food insecurity Transportation: Patient denies transportation needs. Childcare: Discussed no children allowed at ultrasound appointments.   First visit review I reviewed new OB appt with pt. I explained she will have blood work and  pap smear/pelvic exam if indicated. Explained pt will be seen by Zelda Hummer, CNM at first visit; encounter routed to appropriate provider.   Beola Skeens, CMA 10/11/2023  8:32 AM

## 2023-10-14 ENCOUNTER — Other Ambulatory Visit

## 2023-10-14 ENCOUNTER — Telehealth: Payer: Self-pay | Admitting: Certified Nurse Midwife

## 2023-10-14 NOTE — Telephone Encounter (Signed)
 Reached out to pt to reschedule dating scan that was scheduled on 10/14/2023 at 2:00.  Was able to reschedule the appt to 10/18/2023 at 3:00.

## 2023-10-18 ENCOUNTER — Ambulatory Visit (INDEPENDENT_AMBULATORY_CARE_PROVIDER_SITE_OTHER)

## 2023-10-18 DIAGNOSIS — Z3A10 10 weeks gestation of pregnancy: Secondary | ICD-10-CM

## 2023-10-18 DIAGNOSIS — O3680X0 Pregnancy with inconclusive fetal viability, not applicable or unspecified: Secondary | ICD-10-CM | POA: Diagnosis not present

## 2023-10-22 MED ORDER — ONDANSETRON 4 MG PO TBDP: 4.0000 mg | ORAL_TABLET | Freq: Three times a day (TID) | ORAL | 2 refills | Status: DC | PRN

## 2023-10-22 NOTE — Addendum Note (Signed)
 Addended by: KIZZIE CAMELIA CROME on: 10/22/2023 02:45 PM   Modules accepted: Orders

## 2023-10-25 ENCOUNTER — Emergency Department: Admission: EM | Admit: 2023-10-25 | Discharge: 2023-10-25 | Disposition: A | Discharge status: Home / Self Care

## 2023-10-25 ENCOUNTER — Other Ambulatory Visit: Payer: Self-pay

## 2023-10-25 DIAGNOSIS — R519 Headache, unspecified: Secondary | ICD-10-CM | POA: Insufficient documentation

## 2023-10-25 DIAGNOSIS — H538 Other visual disturbances: Secondary | ICD-10-CM | POA: Diagnosis not present

## 2023-10-25 DIAGNOSIS — R55 Syncope and collapse: Secondary | ICD-10-CM | POA: Insufficient documentation

## 2023-10-25 DIAGNOSIS — R42 Dizziness and giddiness: Secondary | ICD-10-CM | POA: Diagnosis present

## 2023-10-25 LAB — COMPREHENSIVE METABOLIC PANEL WITH GFR
ALT: 18 U/L (ref 0–44)
AST: 19 U/L (ref 15–41)
Albumin: 3.4 g/dL — ABNORMAL LOW (ref 3.5–5.0)
Alkaline Phosphatase: 35 U/L — ABNORMAL LOW (ref 38–126)
Anion gap: 11 (ref 5–15)
BUN: 6 mg/dL (ref 6–20)
CO2: 24 mmol/L (ref 22–32)
Calcium: 9.2 mg/dL (ref 8.9–10.3)
Chloride: 102 mmol/L (ref 98–111)
Creatinine, Ser: 0.73 mg/dL (ref 0.44–1.00)
GFR, Estimated: >60 mL/min (ref >60)
Glucose, Bld: 86 mg/dL (ref 70–99)
Potassium: 3.6 mmol/L (ref 3.5–5.1)
Sodium: 137 mmol/L (ref 135–145)
Total Bilirubin: 0.4 mg/dL (ref 0.0–1.2)
Total Protein: 7 g/dL (ref 6.5–8.1)

## 2023-10-25 LAB — URINALYSIS, ROUTINE W REFLEX MICROSCOPIC
Bilirubin Urine: NEGATIVE
Glucose, UA: NEGATIVE mg/dL
Hgb urine dipstick: NEGATIVE
Ketones, ur: NEGATIVE mg/dL
Leukocytes,Ua: NEGATIVE
Nitrite: NEGATIVE
Protein, ur: 30 mg/dL — AB
Specific Gravity, Urine: 1.026 (ref 1.005–1.030)
pH: 6 (ref 5.0–8.0)

## 2023-10-25 LAB — CBC WITH DIFFERENTIAL/PLATELET
Abs Immature Granulocytes: 0.01 K/uL (ref 0.00–0.07)
Basophils Absolute: 0 K/uL (ref 0.0–0.1)
Basophils Relative: 0 %
Eosinophils Absolute: 0.1 K/uL (ref 0.0–0.5)
Eosinophils Relative: 1 %
HCT: 34.8 % — ABNORMAL LOW (ref 36.0–46.0)
Hemoglobin: 12 g/dL (ref 12.0–15.0)
Immature Granulocytes: 0 %
Lymphocytes Relative: 31 %
Lymphs Abs: 2.1 K/uL (ref 0.7–4.0)
MCH: 29.3 pg (ref 26.0–34.0)
MCHC: 34.5 g/dL (ref 30.0–36.0)
MCV: 84.9 fL (ref 80.0–100.0)
Monocytes Absolute: 0.5 K/uL (ref 0.1–1.0)
Monocytes Relative: 7 %
Neutro Abs: 4.1 K/uL (ref 1.7–7.7)
Neutrophils Relative %: 61 %
Platelets: 257 K/uL (ref 150–400)
RBC: 4.1 MIL/uL (ref 3.87–5.11)
RDW: 12 % (ref 11.5–15.5)
WBC: 6.9 K/uL (ref 4.0–10.5)
nRBC: 0 % (ref 0.0–0.2)

## 2023-10-25 NOTE — Discharge Instructions (Addendum)
 Your blood work, urinalysis and EKG were normal today. I suspect your symptoms today were due to anxiety.  Make sure that you drink lots of water, eat regularly and take time for yourself. Return to the ED with any worsening symptoms.

## 2023-10-25 NOTE — ED Provider Notes (Signed)
 [facility] Provider Note    Event Date/Time   First MD Initiated Contact with Patient 10/25/23 1645     (approximate)   History   Dizziness   HPI  Gwendolyn Garrett is a 23 y.o. female [phone] who presents for evaluation of dizziness. Patient states she was sitting at the nail salon waiting to get a pedicure when she began to feel lightheaded, sweaty, and her vision was blurry and she had some tingling in her hands.  Patient reports that she is mostly feeling back to baseline now but thinks that she may have had a panic attack.  She endorses being under a significant amount of stress at this time.  Only symptom at this time is vision being slightly blurry and a mild headache.  She did not have any chest pain or shortness of breath at the time of the incident.      Physical Exam   Triage Vital Signs: ED Triage Vitals  Encounter Vitals Group     BP 10/25/23 1557 129/80     Girls Systolic BP Percentile --      Girls Diastolic BP Percentile --      Boys Systolic BP Percentile --      Boys Diastolic BP Percentile --      Pulse Rate 10/25/23 1557 90     Resp 10/25/23 1557 16     Temp 10/25/23 1557 98 F (36.7 C)     Temp Source 10/25/23 1557 Oral     SpO2 10/25/23 1557 100 %     Weight 10/25/23 1558 221 lb (100.2 kg)     Height --      Head Circumference --      Peak Flow --      Pain Score 10/25/23 1558 8     Pain Loc --      Pain Education --      Exclude from Growth Chart --     Most recent vital signs: Vitals:   10/25/23 1557  BP: 129/80  Pulse: 90  Resp: 16  Temp: 98 F (36.7 C)  SpO2: 100%   General: Awake, no distress.  CV:  Good peripheral perfusion.  RRR. Resp:  Normal effort.  CTAB. Abd:  No distention.  Other:  No focal neurodeficits.  PERRL, EOM intact, no ataxia, no pronator drift.   ED Results / Procedures / Treatments   Labs (all labs ordered are listed, but only abnormal results are displayed) Labs Reviewed  CBC  WITH DIFFERENTIAL/PLATELET - Abnormal; Notable for the following components:      Result Value   HCT 34.8 (*)    All other components within normal limits  COMPREHENSIVE METABOLIC PANEL WITH GFR - Abnormal; Notable for the following components:   Albumin 3.4 (*)    Alkaline Phosphatase 35 (*)    All other components within normal limits  URINALYSIS, ROUTINE W REFLEX MICROSCOPIC - Abnormal; Notable for the following components:   Color, Urine YELLOW (*)    APPearance HAZY (*)    Protein, ur 30 (*)    Bacteria, UA RARE (*)    All other components within normal limits     EKG  ED provider interpretation: Normal sinus rhythm with sinus arrhythmia.  Vent. rate 86 BPM PR interval 146 ms QRS duration 80 ms QT/QTcB 338/404 ms P-R-T axes 77 56 31   RADIOLOGY  PROCEDURES:  Critical Care performed: No  Procedures   MEDICATIONS ORDERED IN ED: Medications - No  data to display   IMPRESSION / MDM / ASSESSMENT AND PLAN / ED COURSE  I reviewed the triage vital signs and the nursing notes.                             23 year old female presents for evaluation of dizziness.  Vital signs are stable patient NAD on exam.  Differential diagnosis includes, but is not limited to, electrolyte abnormality, anemia, cardiac arrhythmia, dehydration, anxiety, vertigo.  Patient's presentation is most consistent with acute complicated illness / injury requiring diagnostic workup.  Patient is overall well-appearing and physical exam is reassuring.  Did consider possible ruptured ectopic as a potential cause but patient has documented IUP from ultrasound on 7/18.  CBC and CMP are both unremarkable.  UA shows presence of protein and rare bacteria.  Protein is consistent with patient's baseline.  EKG shows normal sinus rhythm.  Based on reassuring workup, feel patient is stable for outpatient management.  Suspect that her near syncope was due to anxiety.  Also encouraged her to make sure she  stays well-hydrated, is eating regularly and takes time for herself.  We reviewed return precautions.  Patient voiced understanding, all questions were answered and she was stable at discharge.      FINAL CLINICAL IMPRESSION(S) / ED DIAGNOSES   Final diagnoses:  Near syncope     Rx / DC Orders   ED Discharge Orders     None        Note:  This document was prepared using Dragon voice recognition software and may include unintentional dictation errors.   Cleaster Tinnie LABOR, PA-C 10/25/23 1825    Clarine Ozell LABOR, MD 10/26/23 [phone]

## 2023-10-25 NOTE — ED Notes (Signed)
 Patient declined discharge vital signs.

## 2023-10-25 NOTE — ED Triage Notes (Signed)
 STates felt hot and then dizzy with blurred vision while at nail salon.  Patient states she is unsure if it was an anxiety attack.  States feeling less hot, vision still slightly blurred and c/o headache.  AAOx3.  Skin warm and dry. Ambulates with easy and steady gait. MAE equally and strong. Speech clear. Equal facial movements.  Equal sensation throughout. NAD

## 2023-10-28 ENCOUNTER — Other Ambulatory Visit (HOSPITAL_COMMUNITY)
Admission: RE | Admit: 2023-10-28 | Discharge: 2023-10-28 | Disposition: A | Discharge status: Home / Self Care | Source: Ambulatory Visit | Attending: Certified Nurse Midwife | Admitting: Certified Nurse Midwife

## 2023-10-28 ENCOUNTER — Encounter: Payer: Self-pay | Admitting: Certified Nurse Midwife

## 2023-10-28 ENCOUNTER — Ambulatory Visit (INDEPENDENT_AMBULATORY_CARE_PROVIDER_SITE_OTHER): Admitting: Certified Nurse Midwife

## 2023-10-28 VITALS — BP 123/78 | HR 121 | Wt 210.5 lb

## 2023-10-28 DIAGNOSIS — Z113 Encounter for screening for infections with a predominantly sexual mode of transmission: Secondary | ICD-10-CM

## 2023-10-28 DIAGNOSIS — Z3480 Encounter for supervision of other normal pregnancy, unspecified trimester: Secondary | ICD-10-CM

## 2023-10-28 DIAGNOSIS — Z124 Encounter for screening for malignant neoplasm of cervix: Secondary | ICD-10-CM

## 2023-10-28 DIAGNOSIS — Z0184 Encounter for antibody response examination: Secondary | ICD-10-CM

## 2023-10-28 DIAGNOSIS — Z130 Encounter for screening for diseases of the blood and blood-forming organs and certain disorders involving the immune mechanism: Secondary | ICD-10-CM

## 2023-10-28 DIAGNOSIS — Z3481 Encounter for supervision of other normal pregnancy, first trimester: Secondary | ICD-10-CM | POA: Diagnosis not present

## 2023-10-28 DIAGNOSIS — Z1379 Encounter for other screening for genetic and chromosomal anomalies: Secondary | ICD-10-CM

## 2023-10-28 DIAGNOSIS — Z3A12 12 weeks gestation of pregnancy: Secondary | ICD-10-CM | POA: Diagnosis not present

## 2023-10-28 DIAGNOSIS — T7589XA Other specified effects of external causes, initial encounter: Secondary | ICD-10-CM

## 2023-10-28 DIAGNOSIS — Z0283 Encounter for blood-alcohol and blood-drug test: Secondary | ICD-10-CM

## 2023-10-28 NOTE — Progress Notes (Signed)
 NEW OB HISTORY AND PHYSICAL  SUBJECTIVE:       Gwendolyn Garrett is a 23 y.o. [phone] female, Patient's last menstrual period was 08/05/2023 (approximate)., Estimated Date of Delivery: 05/11/24, [redacted]w[redacted]d, presents today for establishment of Prenatal Care. She has no unusual complaints.   Relationship Engaged Living: with her sister and 61 yr old daughter Work :none currently Exercise : rare  Alcohol/drugs/smoking/vape:  denies use    Gynecologic History Patient's last menstrual period was 08/05/2023 (approximate). Normal Contraception: none Last Pap: unknown.   Obstetric History OB History  Gravida Para Term Preterm AB Living  4 1 1  2 1   SAB IAB Ectopic Multiple Live Births  2   0 1    # Outcome Date GA Lbr Len/2nd Weight Sex Type Anes PTL Lv  4 Current           3 SAB 04/2023          2 SAB 09/2021          1 Term 05/07/20 [redacted]w[redacted]d / 00:25 8 lb (3.63 kg) F Vag-Spont EPI  LIV    Past Medical History:  Diagnosis Date   No pertinent past medical history     Past Surgical History:  Procedure Laterality Date   WISDOM TOOTH EXTRACTION     all four; age 39    Current Outpatient Medications on File Prior to Visit  Medication Sig Dispense Refill   ondansetron  (ZOFRAN -ODT) 4 MG disintegrating tablet Take 1 tablet (4 mg total) by mouth every 8 (eight) hours as needed for nausea or vomiting. 20 tablet 2   Prenatal Vit-Fe Fumarate-FA (PRENATAL PO) Take by mouth.     No current facility-administered medications on file prior to visit.    Allergies  Allergen Reactions   Penicillins Hives    Social History   Socioeconomic History   Marital status: Single    Spouse name: Not on file   Number of children: 1   Years of education: 12   Highest education level: 12th grade  Occupational History   Occupation: works from home    Comment: Scientist, research (medical)   Occupation: Office manager  Tobacco Use   Smoking status: Never   Smokeless tobacco: Never  Vaping Use   Vaping  status: Former  Substance and Sexual Activity   Alcohol use: No   Drug use: Not Currently   Sexual activity: Not Currently    Partners: Male    Birth control/protection: None  Other Topics Concern   Not on file  Social History Narrative   Not on file   Social Drivers of Health   Financial Resource Strain: Low Risk  (10/11/2023)   Overall Financial Resource Strain (CARDIA)    Difficulty of Paying Living Expenses: Not hard at all  Food Insecurity: No Food Insecurity (10/11/2023)   Hunger Vital Sign    Worried About Running Out of Food in the Last Year: Never true    Ran Out of Food in the Last Year: Never true  Transportation Needs: No Transportation Needs (10/11/2023)   PRAPARE - Administrator, Civil Service (Medical): No    Lack of Transportation (Non-Medical): No  Physical Activity: Insufficiently Active (10/11/2023)   Exercise Vital Sign    Days of Exercise per Week: 2 days    Minutes of Exercise per Session: 10 min  Stress: Stress Concern Present (10/11/2023)   [employer] of Occupational Health - Occupational Stress Questionnaire    Feeling of Stress: To some extent  Social Connections: Moderately Isolated (10/11/2023)   Social Connection and Isolation Panel    Frequency of Communication with Friends and Family: Three times a week    Frequency of Social Gatherings with Friends and Family: Once a week    Attends Religious Services: Never    Database administrator or Organizations: No    Attends Engineer, structural: Not on file    Marital Status: Living with partner  Intimate Partner Violence: Not At Risk (10/11/2023)   Humiliation, Afraid, Rape, and Kick questionnaire    Fear of Current or Ex-Partner: No    Emotionally Abused: No    Physically Abused: No    Sexually Abused: No    Family History  Problem Relation Age of Onset   Lung cancer Mother    Brain cancer Mother    Prostate cancer Father     The following portions of the patient's  history were reviewed and updated as appropriate: allergies, current medications, past OB history, past medical history, past surgical history, past family history, past social history, and problem list.    OBJECTIVE: Initial Physical Exam (New OB)  GENERAL APPEARANCE: alert, well appearing HEAD: normocephalic, atraumatic MOUTH: mucous membranes moist, pharynx normal without lesions THYROID: no thyromegaly or masses present BREASTS: no masses noted, no significant tenderness, no palpable axillary nodes, no skin changes LUNGS: clear to auscultation, no wheezes, rales or rhonchi, symmetric air entry HEART: regular rate and rhythm, no murmurs ABDOMEN: soft, nontender, nondistended, no abnormal masses, no epigastric pain and FHT present EXTREMITIES: no redness or tenderness in the calves or thighs SKIN: normal coloration and turgor, no rashes LYMPH NODES: no adenopathy palpable NEUROLOGIC: alert, oriented, normal speech, no focal findings or movement disorder noted  PELVIC EXAM EXTERNAL GENITALIA: normal appearing vulva with no masses, tenderness or lesions VAGINA: no abnormal discharge or lesions CERVIX: no lesions or cervical motion tenderness UTERUS: gravid ADNEXA: no masses palpable and nontender OB EXAM PELVIMETRY: appears adequate RECTUM: exam not indicated  ASSESSMENT: Normal pregnancy  PLAN: Prenatal care See ordersNew OB counseling: The patient has been given an overview regarding routine prenatal care. Recommendations regarding diet, weight gain, and exercise in pregnancy were given. Prenatal testing, optional genetic testing, carrier screening, and ultrasound use in pregnancy were reviewed.  Benefits of Breast Feeding were discussed. The patient is encouraged to consider nursing her baby post partum. Maternit 21 today. Follow up 4 weeks.   Zelda Hummer, CNM

## 2023-10-29 LAB — CBC/D/PLT+RPR+RH+ABO+RUBIGG...
Antibody Screen: NEGATIVE
Basophils Absolute: 0 x10E3/uL (ref 0.0–0.2)
Basos: 0 %
EOS (ABSOLUTE): 0.1 x10E3/uL (ref 0.0–0.4)
Eos: 1 %
HCV Ab: NONREACTIVE
HIV Screen 4th Generation wRfx: NONREACTIVE
Hematocrit: 38.5 % (ref 34.0–46.6)
Hemoglobin: 12.4 g/dL (ref 11.1–15.9)
Hepatitis B Surface Ag: NEGATIVE
Immature Grans (Abs): 0 x10E3/uL (ref 0.0–0.1)
Immature Granulocytes: 0 %
Lymphocytes Absolute: 1.9 x10E3/uL (ref 0.7–3.1)
Lymphs: 34 %
MCH: 28.6 pg (ref 26.6–33.0)
MCHC: 32.2 g/dL (ref 31.5–35.7)
MCV: 89 fL (ref 79–97)
Monocytes Absolute: 0.3 x10E3/uL (ref 0.1–0.9)
Monocytes: 6 %
Neutrophils Absolute: 3.1 x10E3/uL (ref 1.4–7.0)
Neutrophils: 58 %
Platelets: 283 x10E3/uL (ref 150–450)
RBC: 4.33 x10E6/uL (ref 3.77–5.28)
RDW: 12.2 % (ref 11.7–15.4)
RPR Ser Ql: NONREACTIVE
Rh Factor: POSITIVE
Rubella Antibodies, IGG: 3.79 {index} (ref >0.99)
Varicella zoster IgG: REACTIVE
WBC: 5.4 x10E3/uL (ref 3.4–10.8)

## 2023-10-29 LAB — URINALYSIS, ROUTINE W REFLEX MICROSCOPIC
Bilirubin, UA: NEGATIVE
Glucose, UA: NEGATIVE
Ketones, UA: NEGATIVE
Nitrite, UA: NEGATIVE
Protein,UA: NEGATIVE
RBC, UA: NEGATIVE
Specific Gravity, UA: 1.014 (ref 1.005–1.030)
Urobilinogen, Ur: 0.2 mg/dL (ref 0.2–1.0)
pH, UA: 6 (ref 5.0–7.5)

## 2023-10-29 LAB — SPECIMEN STATUS REPORT

## 2023-10-29 LAB — MICROSCOPIC EXAMINATION
Casts: NONE SEEN /LPF
Epithelial Cells (non renal): >10 /HPF — AB (ref 0–10)
RBC, Urine: NONE SEEN /HPF (ref 0–2)

## 2023-10-29 LAB — CERVICOVAGINAL ANCILLARY ONLY
Chlamydia: NEGATIVE
Comment: NEGATIVE
Comment: NORMAL
Neisseria Gonorrhea: NEGATIVE

## 2023-10-29 LAB — HCV INTERPRETATION

## 2023-10-30 LAB — MONITOR DRUG PROFILE 14(MW)

## 2023-10-30 LAB — NICOTINE SCREEN, URINE

## 2023-10-30 LAB — SPECIMEN STATUS REPORT

## 2023-11-01 LAB — URINE CULTURE, OB REFLEX

## 2023-11-01 LAB — URINALYSIS, ROUTINE W REFLEX MICROSCOPIC

## 2023-11-01 LAB — SPECIMEN STATUS REPORT

## 2023-11-01 LAB — CULTURE, OB URINE

## 2023-11-05 LAB — CYTOLOGY - PAP
Comment: NEGATIVE
Diagnosis: UNDETERMINED — AB
High risk HPV: POSITIVE — AB

## 2023-11-05 LAB — MONITOR DRUG PROFILE 14(MW)
Amphetamine Scrn, Ur: NEGATIVE ng/mL
BARBITURATE SCREEN URINE: NEGATIVE ng/mL
BENZODIAZEPINE SCREEN, URINE: NEGATIVE ng/mL
Buprenorphine, Urine: NEGATIVE ng/mL
Cocaine (Metab) Scrn, Ur: NEGATIVE ng/mL
Creatinine(Crt), U: 189.7 mg/dL (ref 20.0–300.0)
Fentanyl, Urine: NEGATIVE pg/mL
Meperidine Screen, Urine: NEGATIVE ng/mL
Methadone Screen, Urine: NEGATIVE ng/mL
OXYCODONE+OXYMORPHONE UR QL SCN: NEGATIVE ng/mL
Opiate Scrn, Ur: NEGATIVE ng/mL
Ph of Urine: 6.1 (ref 4.5–8.9)
Phencyclidine Qn, Ur: NEGATIVE ng/mL
Propoxyphene Scrn, Ur: NEGATIVE ng/mL
SPECIFIC GRAVITY: 1.013
Tramadol Screen, Urine: NEGATIVE ng/mL

## 2023-11-05 LAB — NICOTINE SCREEN, URINE: Cotinine Ql Scrn, Ur: POSITIVE ng/mL — AB

## 2023-11-05 LAB — CANNABINOID (GC/MS), URINE
Cannabinoid: POSITIVE — AB
Carboxy THC (GC/MS): 527 ng/mL

## 2023-11-05 LAB — SPECIMEN STATUS REPORT

## 2023-11-06 ENCOUNTER — Encounter: Payer: Self-pay | Admitting: Certified Nurse Midwife

## 2023-11-07 ENCOUNTER — Other Ambulatory Visit

## 2023-11-07 ENCOUNTER — Other Ambulatory Visit: Payer: Self-pay

## 2023-11-07 DIAGNOSIS — Z1379 Encounter for other screening for genetic and chromosomal anomalies: Secondary | ICD-10-CM

## 2023-11-13 ENCOUNTER — Other Ambulatory Visit: Payer: Self-pay | Admitting: Medical Genetics

## 2023-11-13 LAB — MATERNIT 21 PLUS CORE, BLOOD
Fetal Fraction: 12
Result (T21): NEGATIVE
Trisomy 13 (Patau syndrome): NEGATIVE
Trisomy 18 (Edwards syndrome): NEGATIVE
Trisomy 21 (Down syndrome): NEGATIVE

## 2023-11-15 ENCOUNTER — Other Ambulatory Visit

## 2023-11-28 ENCOUNTER — Ambulatory Visit (INDEPENDENT_AMBULATORY_CARE_PROVIDER_SITE_OTHER): Admitting: Obstetrics

## 2023-11-28 VITALS — BP 115/77 | HR 91 | Wt 212.2 lb

## 2023-11-28 DIAGNOSIS — Z3689 Encounter for other specified antenatal screening: Secondary | ICD-10-CM

## 2023-11-28 DIAGNOSIS — Z3480 Encounter for supervision of other normal pregnancy, unspecified trimester: Secondary | ICD-10-CM

## 2023-11-28 NOTE — Assessment & Plan Note (Signed)
-  Anatomy US  ordered -Anticipatory guidance about 2nd trimester and fetal development -Reviewed danger signs and when to seek medical attention

## 2023-11-28 NOTE — Progress Notes (Signed)
    Return Prenatal Note   Assessment/Plan   Plan  23 y.o. H5E8978 at [redacted]w[redacted]d presents for follow-up OB visit. Reviewed prenatal record including previous visit note.  Supervision of other normal pregnancy, antepartum -Anatomy US  ordered -Anticipatory guidance about 2nd trimester and fetal development -Reviewed danger signs and when to seek medical attention   Orders Placed This Encounter  Procedures   US  OB Comp + 14 Wk    Standing Status:   Future    Expected Date:   12/29/2023    Expiration Date:   11/27/2024    Reason for Exam (SYMPTOM  OR DIAGNOSIS REQUIRED):   anatomy    Preferred Imaging Location?:   Internal   Return in about 4 weeks (around 12/26/2023).   Future Appointments  Date Time Provider Department Center  12/26/2023  1:00 PM [facility] US  1 [facility] None  12/26/2023  2:35 PM Raylinn Kosar, Gwendolyn HERO, CNM [facility] None    For next visit:  Routine prenatal care    Subjective  Gwendolyn Garrett is much better in the 2nd trimester. She is starting to feel some flutters.  Movement: Present Contractions: Not present  Objective   Flow sheet Vitals: Pulse Rate: 91 BP: 115/77 Fetal Heart Rate (bpm): 155 Total weight gain: 12 lb 3.2 oz (5.534 kg)  General Appearance  No acute distress, well appearing, and well nourished Pulmonary   Normal work of breathing Neurologic   Alert and oriented to person, place, and time Psychiatric   Mood and affect within normal limits  Gwendolyn Garrett, CNM 11/28/23 5:11 PM

## 2023-12-26 ENCOUNTER — Encounter: Payer: Self-pay | Admitting: Obstetrics

## 2023-12-26 ENCOUNTER — Other Ambulatory Visit (HOSPITAL_COMMUNITY)
Admission: RE | Admit: 2023-12-26 | Discharge: 2023-12-26 | Disposition: A | Discharge status: Home / Self Care | Source: Ambulatory Visit | Attending: Obstetrics | Admitting: Obstetrics

## 2023-12-26 ENCOUNTER — Ambulatory Visit

## 2023-12-26 ENCOUNTER — Ambulatory Visit (INDEPENDENT_AMBULATORY_CARE_PROVIDER_SITE_OTHER): Admitting: Obstetrics

## 2023-12-26 VITALS — BP 126/76 | HR 92 | Wt 208.0 lb

## 2023-12-26 DIAGNOSIS — Z3A20 20 weeks gestation of pregnancy: Secondary | ICD-10-CM | POA: Diagnosis not present

## 2023-12-26 DIAGNOSIS — Z3480 Encounter for supervision of other normal pregnancy, unspecified trimester: Secondary | ICD-10-CM

## 2023-12-26 DIAGNOSIS — N898 Other specified noninflammatory disorders of vagina: Secondary | ICD-10-CM | POA: Insufficient documentation

## 2023-12-26 DIAGNOSIS — Z3689 Encounter for other specified antenatal screening: Secondary | ICD-10-CM | POA: Diagnosis not present

## 2023-12-26 NOTE — Assessment & Plan Note (Signed)
 Self swabbed for BV and yeast. Tx pending results.

## 2023-12-26 NOTE — Progress Notes (Signed)
    Return Prenatal Note   Subjective   23 y.o. H5E8978 at [redacted]w[redacted]d presents for this follow-up prenatal visit.  Patient had her anatomy scan today. She has a history of both BV and yeast this pregnancy. She is reporting heavy, thick, white discharge and vaginal irritation.  Patient reports: Movement: Present Contractions: Not present  Objective   Flow sheet Vitals: Pulse Rate: 92 BP: 126/76 Fetal Heart Rate (bpm): 161 Total weight gain: 8 lb (3.629 kg)  General Appearance  No acute distress, well appearing, and well nourished Pulmonary   Normal work of breathing Neurologic   Alert and oriented to person, place, and time Psychiatric   Mood and affect within normal limits   Assessment/Plan   Plan  23 y.o. H5E8978 at [redacted]w[redacted]d presents for follow-up OB visit. Reviewed prenatal record including previous visit note.  Supervision of other normal pregnancy, antepartum Normal anatomy scan RTC 4 wks for ROB  Vaginal irritation Self swabbed for BV and yeast. Tx pending results.      No orders of the defined types were placed in this encounter.  Return in about 4 weeks (around 01/23/2024) for ROB.   Future Appointments  Date Time Provider Department Center  01/23/2024  2:35 PM Lynda Bradley, CNM [facility] None    For next visit:  continue with routine prenatal care     Lauraine Lakes, CNM  09/25/252:39 PM

## 2023-12-26 NOTE — Assessment & Plan Note (Signed)
 Normal anatomy scan RTC 4 wks for ROB

## 2023-12-30 ENCOUNTER — Encounter: Payer: Self-pay | Admitting: Obstetrics

## 2023-12-30 ENCOUNTER — Other Ambulatory Visit: Payer: Self-pay | Admitting: Obstetrics

## 2023-12-30 DIAGNOSIS — B3731 Acute candidiasis of vulva and vagina: Secondary | ICD-10-CM | POA: Insufficient documentation

## 2023-12-30 LAB — CERVICOVAGINAL ANCILLARY ONLY
Bacterial Vaginitis (gardnerella): NEGATIVE
Candida Glabrata: NEGATIVE
Candida Vaginitis: POSITIVE — AB
Chlamydia: NEGATIVE
Comment: NEGATIVE
Comment: NEGATIVE
Comment: NEGATIVE
Comment: NEGATIVE
Comment: NEGATIVE
Comment: NORMAL
Neisseria Gonorrhea: NEGATIVE
Trichomonas: NEGATIVE

## 2023-12-30 MED ORDER — FLUCONAZOLE 150 MG PO TABS: 150.0000 mg | ORAL_TABLET | Freq: Once | ORAL | 0 refills | Status: AC

## 2024-01-16 ENCOUNTER — Other Ambulatory Visit: Payer: Self-pay | Admitting: Medical Genetics

## 2024-01-16 DIAGNOSIS — Z006 Encounter for examination for normal comparison and control in clinical research program: Secondary | ICD-10-CM

## 2024-01-23 ENCOUNTER — Encounter: Admitting: Advanced Practice Midwife

## 2024-02-21 ENCOUNTER — Ambulatory Visit (INDEPENDENT_AMBULATORY_CARE_PROVIDER_SITE_OTHER): Payer: Self-pay | Admitting: Certified Nurse Midwife

## 2024-02-21 ENCOUNTER — Other Ambulatory Visit

## 2024-02-21 DIAGNOSIS — Z3A28 28 weeks gestation of pregnancy: Secondary | ICD-10-CM

## 2024-02-21 DIAGNOSIS — Z3483 Encounter for supervision of other normal pregnancy, third trimester: Secondary | ICD-10-CM

## 2024-02-21 NOTE — Progress Notes (Unsigned)
    Return Prenatal Note   Subjective   23 y.o. H5E8978 at [redacted]w[redacted]d presents for this follow-up prenatal visit.  Patient *** Patient reports:    Objective   Flow sheet Vitals:   Total weight gain: 8 lb (3.629 kg)  General Appearance  No acute distress, well appearing, and well nourished Pulmonary   Normal work of breathing Neurologic   Alert and oriented to person, place, and time Psychiatric   Mood and affect within normal limits   Assessment/Plan   Plan  23 y.o. H5E8978 at [redacted]w[redacted]d presents for follow-up OB visit. Reviewed prenatal record including previous visit note.  No problem-specific Assessment & Plan notes found for this encounter.      Orders Placed This Encounter  Procedures   Tdap vaccine greater than or equal to 7yo IM   28 Week RH+Panel   No follow-ups on file.   Future Appointments  Date Time Provider Department Center  02/21/2024  2:40 PM [facility] LAB [facility] None  02/21/2024  3:15 PM Jarret Torre, Damien, CNM [facility] None    For next visit:  continue with routine prenatal care     Damien Parsley, CNM [facility] 11/21/251:27 PM

## 2024-02-21 NOTE — Patient Instructions (Signed)
 Third Trimester of Pregnancy  The third trimester of pregnancy is from week 28 through week 40. This is months 7 through 9. The third trimester is a time when your baby is growing fast. Body changes during your third trimester Your body continues to change during this time. The changes usually go away after your baby is born. Physical changes You will continue to gain weight. You may get stretch marks on your hips, belly, and breasts. Your breasts will keep growing and may hurt. A yellow fluid (colostrum) may leak from your breasts. This is the first milk you're making for your baby. Your hair may grow faster and get thicker. In some cases, you may get hair loss. Your belly button may stick out. You may have more swelling in your hands, face, or ankles. Health changes You may have heartburn. You may feel short of breath. This is caused by the uterus that is now bigger. You may have more aches in the pelvis, back, or thighs. You may have more tingling or numbness in your hands, arms, and legs. You may pee more often. You may have trouble pooping (constipation) or swollen veins in the butt that can itch or get painful (hemorrhoids). Other changes You may have more problems sleeping. You may notice the baby moving lower in your belly (dropping). You may have more fluid coming from your vagina. Your joints may feel loose, and you may have pain around your pelvic bone. Follow these instructions at home: Medicines Take medicines only as told by your health care provider. Some medicines are not safe during pregnancy. Your provider may change the medicines that you take. Do not take any medicines unless told to by your provider. Take a prenatal vitamin that has at least 600 micrograms (mcg) of folic acid. Do not use herbal medicines, illegal drugs, or medicines that are not approved by your provider. Eating and drinking While you're pregnant your body needs additional nutrition to help  support your growing baby. Talk with your provider about your nutritional needs. Activity Most women are able to exercise regularly during pregnancy. Exercise routines may need to change at the end of your pregnancy. Talk to your provider about your activities and exercise routine. Relieving pain and discomfort Rest often with your legs raised if you have leg cramps or low back pain. Take warm sitz baths to soothe pain from hemorrhoids. Use hemorrhoid cream if your provider says it's okay. Wear a good, supportive bra if your breasts hurt. Do not use hot tubs, steam rooms, or saunas. Do not douche. Do not use tampons or scented pads. Safety Talk to your provider before traveling far distances. Wear your seatbelt at all times when you're in a car. Talk to your provider if someone hits you, hurts you, or yells at you. Preparing for birth To prepare for your baby: Take childbirth and breastfeeding classes. Visit the hospital and tour the maternity area. Buy a rear-facing car seat. Learn how to install it in your car. General instructions Avoid cat litter boxes and soil used by cats. These things carry germs that can cause harm to your pregnancy and your baby. Do not drink alcohol, smoke, vape, or use products with nicotine  or tobacco in them. If you need help quitting, talk with your provider. Keep all follow-up visits for your third trimester. Your provider will do more exams and tests during this trimester. Write down your questions. Take them to your prenatal visits. Your provider also will: Talk with you about  your overall health. Give you advice or refer you to specialists who can help with different needs, including: Mental health and counseling. Foods and healthy eating. Ask for help if you need help with food. Where to find more information American Pregnancy Association: americanpregnancy.org [employer] of Obstetricians and Gynecologists: acog.org Office on [employer] Health:  travellesson.ca Contact a health care provider if: You have a headache that does not go away when you take medicine. You have any of these problems: You can't eat or drink. You have nausea and vomiting. You have watery poop (diarrhea) for 2 days or more. You have pain when you pee, or your pee smells bad. You have been sick for 2 days or more and aren't getting better. Contact your provider right away if: You have any of these coming from your vagina: Abnormal discharge. Bad-smelling fluid. Bleeding. Your baby is moving less than usual. You have signs of labor: You have any contractions, belly cramping, or have pain in your pelvis or lower back before 37 weeks of pregnancy (preterm labor). You have regular contractions that are less than 5 minutes apart. Your water breaks. You have symptoms of high blood pressure or preeclampsia. These include: A severe, throbbing headache that does not go away. Sudden or extreme swelling of your face, hands, legs, or feet. Vision problems: You see spots. You have blurry vision. Your eyes are sensitive to light. If you can't reach your provider, go to an urgent care or emergency room. Get help right away if: You faint, become confused, or can't think clearly. You have chest pain or trouble breathing. You have any kind of injury, such as from a fall or a car crash. These symptoms may be an emergency. Call 911 right away. Do not wait to see if the symptoms will go away. Do not drive yourself to the hospital. This information is not intended to replace advice given to you by your health care provider. Make sure you discuss any questions you have with your health care provider. Document Revised: 12/20/2022 Document Reviewed: 07/20/2022 Elsevier Patient Education  2024 Elsevier Inc.Oral Glucose Tolerance Test During Pregnancy Why am I having this test? The oral glucose tolerance test (OGTT) is done to check how your body uses blood sugar, also  called glucose. It's one of many tests used to diagnose the type of diabetes you can get while pregnant. This type of diabetes is called gestational diabetes mellitus (GDM). You may get GDM during the middle part of your pregnancy. In most cases, it goes away after you give birth. Most people get tested for GDM around weeks 24-28 of pregnancy. You may have the test sooner if: You or your mother had diabetes while pregnant. A person in your family has diabetes. You're having more than one baby this pregnancy. You've had a baby before who weighed more than 9 pounds (4 kg) at birth. You have high blood pressure or heart disease. You have a large body. You're not active. What is being tested? This test measures your blood sugar at different times. It shows how well your body uses the sugar in your blood. What kind of sample is taken?  A sample of blood is needed for this test. The sample is taken by putting a needle into a blood vessel. How do I prepare for this test? Eat your normal meals the day before the test. Your health care provider will tell you about: Eating or drinking on the day of the test. You may need to  fast for 8-10 hours before the test. When you fast, you can only have water. Changing or stopping your regular medicines. Some medicines may affect your test results. Tell a health care provider about: All medicines you take. These include vitamins, herbs, eye drops, and creams. What happens during the test? The test involves these steps: Your blood sugar will be checked. It's called your fasting blood sugar if you fasted before the test. You'll drink a sugary mixture. Your blood sugar will be checked again. For a 1-hour test, it will be checked after an hour. For a 3-hour test, it will be checked 1, 2, and 3 hours after you drink the sugary mixture. The test takes 1-3 hours. You'll need to stay at the testing place during this time. During the testing time: Do not eat or drink  anything after the sugary drink. Do not exercise. Do not use any products that contain nicotine  or tobacco. These products include cigarettes, chewing tobacco, and vaping devices, such as e-cigarettes. The test may vary among providers and hospitals. How are the results reported? Your provider will compare your results to normal values for the kind of test that you had done. You may need to call or meet with your provider to get your results. What do the results mean? Your provider can tell you what blood sugar levels are normal for the test you're doing. If two or more of your blood sugar levels are at or above normal, you may be diagnosed with GDM. If only one level is high, your provider may suggest: Doing the test again. Doing other tests to confirm a diagnosis. Talk with your provider about what your results mean. Questions to ask your health care provider Ask your provider, or the department doing the test: When will my results be ready? How will I get my results? What are my next steps? This information is not intended to replace advice given to you by your health care provider. Make sure you discuss any questions you have with your health care provider. Document Revised: 07/23/2022 Document Reviewed: 07/23/2022 Elsevier Patient Education  2024 [employer].

## 2024-02-23 NOTE — Progress Notes (Deleted)
 Opened in error

## 2024-03-24 ENCOUNTER — Ambulatory Visit (INDEPENDENT_AMBULATORY_CARE_PROVIDER_SITE_OTHER)

## 2024-03-24 ENCOUNTER — Other Ambulatory Visit (HOSPITAL_COMMUNITY)
Admission: RE | Admit: 2024-03-24 | Discharge: 2024-03-24 | Disposition: A | Discharge status: Home / Self Care | Source: Ambulatory Visit | Attending: Certified Nurse Midwife | Admitting: Certified Nurse Midwife

## 2024-03-24 VITALS — BP 110/75 | HR 99 | Ht 70.0 in | Wt 221.7 lb

## 2024-03-24 DIAGNOSIS — Z202 Contact with and (suspected) exposure to infections with a predominantly sexual mode of transmission: Secondary | ICD-10-CM

## 2024-03-24 LAB — CERVICOVAGINAL ANCILLARY ONLY
Bacterial Vaginitis (gardnerella): NEGATIVE
Candida Glabrata: NEGATIVE
Candida Vaginitis: NEGATIVE
Chlamydia: NEGATIVE
Comment: NEGATIVE
Comment: NEGATIVE
Comment: NEGATIVE
Comment: NEGATIVE
Comment: NEGATIVE
Comment: NORMAL
Neisseria Gonorrhea: NEGATIVE
Trichomonas: NEGATIVE

## 2024-03-24 NOTE — Progress Notes (Signed)
" ° ° °  NURSE VISIT NOTE  Subjective:    Patient ID: Gwendolyn Garrett, female    DOB: 10-01-2000, 23 y.o.   MRN: [id number]  HPI  Patient is a 23 y.o. Gwendolyn Garrett female who presents for STI testing for possible exposure. Denies abnormal vaginal bleeding or significant pelvic pain or fever. denies dysuria and urinary frequency. Patient denies a history of known exposure to STD.   Objective:    Ht 5' 10 (1.778 m)   Wt 221 lb 11.2 oz (100.6 kg)   LMP 08/05/2023 (Approximate)   BMI 31.81 kg/m    No results found for any visits on 03/24/24.  Assessment:   1. Possible exposure to STI     nonspecific vaginitis  Plan:   GC and chlamydia DNA  probe sent to lab.  ROV prn if symptoms persist or worsen.   Mathis LITTIE Getting, CMA  "

## 2024-03-25 LAB — HEPATITIS B SURFACE ANTIGEN: Hepatitis B Surface Ag: NEGATIVE

## 2024-03-25 LAB — HIV ANTIBODY (ROUTINE TESTING W REFLEX): HIV Screen 4th Generation wRfx: NONREACTIVE

## 2024-03-25 LAB — SYPHILIS: RPR W/REFLEX TO RPR TITER AND TREPONEMAL ANTIBODIES, TRADITIONAL SCREENING AND DIAGNOSIS ALGORITHM: RPR Ser Ql: NONREACTIVE

## 2024-03-25 LAB — HEPATITIS C ANTIBODY: Hep C Virus Ab: NONREACTIVE

## 2024-03-27 ENCOUNTER — Ambulatory Visit: Payer: Self-pay

## 2024-04-06 ENCOUNTER — Telehealth: Payer: Self-pay

## 2024-04-06 NOTE — Telephone Encounter (Signed)
 Spoke with patient. She states she was given Cefdinir. Advised needs to also treat symptoms as the abx only treats the infection. Can take Tylenol  and Sudafed (pseudoephedrine behind counter/no phenylephrine  OTC). Can use neti-pot or saline nasal rinse.

## 2024-04-06 NOTE — Telephone Encounter (Signed)
 TRIAGE VOICEMAIL: Patient states she went to Urgent Care yesterday because she wasn't feeling well. She reports her ears were hurting and a sore throat. She was advised she has a Upper Respiratory Infection and was rx'd an abx. She was advised to contact her OB to verify it is ok to take. She took one yesterday and today. She reports she is still not feeling well. She states her throat and ears are still hurting.

## 2024-04-17 NOTE — Progress Notes (Unsigned)
" ° ° °  Return Prenatal Note   Subjective   24 y.o. H5E8978 at [redacted]w[redacted]d presents for this follow-up prenatal visit.  Patient has not been seen except for STI screening since September, Was in the process of moving, forgot about her appointments  Patient reports: would like her membranes swept today -Mood has been good, does struggle with anxiety-mainly being in public spaces, had a few panic episodes this pregnancy, did not have PP anxiety with her her first child.  -She is no longer with the FOB  Movement: Present Contractions: Irritability  Objective   Flow sheet Vitals: Pulse Rate: 95 BP: 127/80 Fundal Height: 37 cm Fetal Heart Rate (bpm): 138 Presentation: Vertex (BSUS) Total weight gain: 25 lb 9.6 oz (11.6 kg)  General Appearance  No acute distress, well appearing, and well nourished Pulmonary   Normal work of breathing Neurologic   Alert and oriented to person, place, and time Psychiatric   Mood and affect within normal limits   Assessment/Plan   Plan  24 y.o. H5E8978 at [redacted]w[redacted]d presents for follow-up OB visit. Reviewed prenatal record including previous visit note.  Supervision of other normal pregnancy, antepartum -TWG 25lbs, WNL -Declined TDAP -BSUS confirms vertex presentation  -Had VE yesterday, VE not necessary today, we do not offer membrane sweep until 39weeks -Her aunt will watch her daughter while in labor -Her sister will be her labor support, she has family for PP support, the FOB will not be involved.  -Reviewed labor warning signs and expectations for birth. Instructed to call office or come to hospital with persistent headache, vision changes, regular contractions, leaking of fluid, decreased fetal movement or vaginal bleeding.       No orders of the defined types were placed in this encounter.  Return in about 1 week (around 04/27/2024) for ROB.   Future Appointments  Date Time Provider Department Center  04/27/2024  8:15 AM Justino Eleanor HERO, CNM  [facility] None  05/05/2024  8:35 AM Starla Harland BROCKS, MD [facility] None  05/11/2024  8:15 AM Jayne Harlene CROME, CNM [facility] None     For next visit:  continue with routine prenatal care     JINNIE HERO [facility], CNM  04/20/2608:09 AM  "

## 2024-04-19 ENCOUNTER — Observation Stay
Admission: EM | Admit: 2024-04-19 | Discharge: 2024-04-19 | Disposition: A | Discharge status: Home / Self Care | Attending: Registered Nurse | Admitting: Registered Nurse

## 2024-04-19 ENCOUNTER — Other Ambulatory Visit: Payer: Self-pay

## 2024-04-19 DIAGNOSIS — O26893 Other specified pregnancy related conditions, third trimester: Principal | ICD-10-CM

## 2024-04-19 DIAGNOSIS — Z3A36 36 weeks gestation of pregnancy: Secondary | ICD-10-CM | POA: Diagnosis not present

## 2024-04-19 DIAGNOSIS — R102 Pelvic and perineal pain unspecified side: Principal | ICD-10-CM

## 2024-04-19 LAB — WET PREP, GENITAL
Clue Cells Wet Prep HPF POC: NONE SEEN
Sperm: NONE SEEN
Trich, Wet Prep: NONE SEEN
WBC, Wet Prep HPF POC: >=10 — AB
Yeast Wet Prep HPF POC: NONE SEEN

## 2024-04-19 LAB — GROUP B STREP BY PCR: Group B strep by PCR: NEGATIVE

## 2024-04-19 NOTE — OB Triage Provider Note (Incomplete)
 LABOR & DELIVERY OB TRIAGE NOTE  SUBJECTIVE  HPI Gwendolyn Garrett is a 24 y.o. H5E8978 at [redacted]w[redacted]d who presents to Labor & Delivery for labor evaluation. This evening, she began feeling intense pelvic pressure, almost like she needed to push. Her last bowel movement was earlier today. She also has noticed some back ache. Denies LOF, vaginal bleeding. Reports good fetal movement.   She has had a lapse in prenatal care since November. Has ROB tomorrow with Jinnie Cookey, CNM   OBJECTIVE  BP 120/74   Pulse 95   Temp 98.2 F (36.8 C) (Oral)   Resp 18   LMP 08/05/2023 (Approximate)   General: NAD, conversant Heart: Well perfused Lungs: Normal WOB Abdomen: Gravid, NT Cervical exam: Dilation: 1.5 Effacement (%): 50 Cervical Position: Posterior Station: -3 Exam by:: CANDIE Barrio, RN   NST I reviewed the NST and it was reactive.  Baseline: 130 Variability: moderate Accelerations: present Decelerations:none Toco: q 7+ mins Category  Results for orders placed or performed during the hospital encounter of 04/19/24 (from the past 24 hours)  Wet prep, genital     Status: Abnormal   Collection Time: 04/19/24  8:57 PM   Specimen: Vaginal  Result Value Ref Range   Yeast Wet Prep HPF POC NONE SEEN NONE SEEN   Trich, Wet Prep NONE SEEN NONE SEEN   Clue Cells Wet Prep HPF POC NONE SEEN NONE SEEN   WBC, Wet Prep HPF POC >=10 (A) <10   Sperm NONE SEEN     ASSESSMENT/ PLAN 1) Pregnancy at H5E8978, [redacted]w[redacted]d, Estimated Date of Delivery: 05/11/24 2) Reassuring maternal/fetal status- Cat 1 tracing 3) Not in labor 4) GBS swab collected during triage visit. Wet prep negative. Will still need Gc/Ct at [facility] tomorrow. Still needs confirmation of presentation by [facility].  5) Reviewed comfort measures for late pregnancy.  6) Discharge home. Keep ROB tomorrow.  Lauraine Lakes, CNM 04/19/24  10:02 PM

## 2024-04-19 NOTE — OB Triage Note (Signed)
 Pt being discharged home by CANDIE Lakes, CNM  NST is reactive. Pt understands plan. Pt knows to return for LOF, increased painful ctx's, decreased fetal movement, increased bleeding. Discharge instructions handed to pt and pt verbalized understanding. Leaving ambulatory

## 2024-04-20 ENCOUNTER — Encounter: Payer: Self-pay | Admitting: Licensed Practical Nurse

## 2024-04-20 ENCOUNTER — Ambulatory Visit: Admitting: Licensed Practical Nurse

## 2024-04-20 VITALS — BP 127/80 | HR 95 | Wt 225.6 lb

## 2024-04-20 DIAGNOSIS — Z3480 Encounter for supervision of other normal pregnancy, unspecified trimester: Secondary | ICD-10-CM

## 2024-04-20 DIAGNOSIS — Z3483 Encounter for supervision of other normal pregnancy, third trimester: Secondary | ICD-10-CM | POA: Diagnosis not present

## 2024-04-20 DIAGNOSIS — Z3A37 37 weeks gestation of pregnancy: Secondary | ICD-10-CM | POA: Diagnosis not present

## 2024-04-20 NOTE — Assessment & Plan Note (Addendum)
-  TWG 25lbs, WNL -Declined TDAP -BSUS confirms vertex presentation  -Had VE yesterday, VE not necessary today, we do not offer membrane sweep until 39weeks -Her aunt will watch her daughter while in labor -Her sister will be her labor support, she has family for PP support, the FOB will not be involved.  -Reviewed labor warning signs and expectations for birth. Instructed to call office or come to hospital with persistent headache, vision changes, regular contractions, leaking of fluid, decreased fetal movement or vaginal bleeding.

## 2024-04-24 ENCOUNTER — Encounter: Payer: Self-pay | Admitting: Licensed Practical Nurse

## 2024-04-27 ENCOUNTER — Encounter: Admitting: Obstetrics

## 2024-04-29 ENCOUNTER — Ambulatory Visit: Admitting: Obstetrics

## 2024-04-29 VITALS — BP 105/68 | HR 97 | Wt 225.0 lb

## 2024-04-29 DIAGNOSIS — O99323 Drug use complicating pregnancy, third trimester: Secondary | ICD-10-CM

## 2024-04-29 DIAGNOSIS — F1290 Cannabis use, unspecified, uncomplicated: Secondary | ICD-10-CM | POA: Diagnosis not present

## 2024-04-29 DIAGNOSIS — Z880 Allergy status to penicillin: Secondary | ICD-10-CM

## 2024-04-29 DIAGNOSIS — Z113 Encounter for screening for infections with a predominantly sexual mode of transmission: Secondary | ICD-10-CM

## 2024-04-29 DIAGNOSIS — Z3A38 38 weeks gestation of pregnancy: Secondary | ICD-10-CM

## 2024-04-29 DIAGNOSIS — O99330 Smoking (tobacco) complicating pregnancy, unspecified trimester: Secondary | ICD-10-CM | POA: Insufficient documentation

## 2024-04-29 DIAGNOSIS — O0933 Supervision of pregnancy with insufficient antenatal care, third trimester: Secondary | ICD-10-CM

## 2024-04-29 DIAGNOSIS — Z3685 Encounter for antenatal screening for Streptococcus B: Secondary | ICD-10-CM

## 2024-04-29 NOTE — Progress Notes (Signed)
" ° ° °  Return Prenatal Note   Subjective  24 y.o. H5E8978 at [redacted]w[redacted]d presents for this follow-up prenatal visit.   Pt's prenatal visits:  04/20/24 - [redacted]w[redacted]d 12/26/23 - [redacted]w[redacted]d 11/28/23 - [redacted]w[redacted]d 10/28/23 - New OB visit 12wks  Pt states she has been busy and forgets to come to visits, also this is not my first rodeo and doesn't feel she needs the frequent care. Would like cervix checked and membranes swept today.   Patient reports: Movement: Present Contractions: Irregular Denies vaginal bleeding or leaking fluid. Objective  Flow sheet Vitals: Pulse Rate: 97 BP: 105/68 Fundal Height: 38 cm Fetal Heart Rate (bpm): 145 Presentation: Vertex (by SVE) Dilation: 3.5 Effacement (%): 70 Station: -2 Total weight gain: 25 lb (11.3 kg)  General Appearance  No acute distress, well appearing, and well nourished Pulmonary   Normal work of breathing Neurologic   Alert and oriented to person, place, and time Psychiatric   Mood and affect within normal limits   Assessment/Plan   Plan  23 y.o. H5E8978 at [redacted]w[redacted]d by LMP=[redacted]w[redacted]d US  presents for follow-up OB visit. Reviewed prenatal record including previous visit note.  1. Insufficient prenatal care in third trimester (Primary) - GBS PCR done in OBT 1/18; needs culture with reflex susceptibilities, obtained today - GC/CT/TV swab today - Cervix 3.5cm today, BOWI, vertex - gentle sweep  - Emphasized importance of visits and improved maternal/fetal outcomes when engaged in regular prenatal care - Labor precautions reviewed  2. Marijuana/Tobacco use in pregnancy  - Positive +THC/Cotinine on new OB labs  - Cessation encouraged - Obtain UDS on labor admission   Return in about 1 week (around 05/06/2024) for ROB .   Future Appointments  Date Time Provider Department Center  05/05/2024  8:35 AM Starla Harland BROCKS, MD [facility] None  05/11/2024  8:15 AM Jayne Harlene CROME, CNM [facility] None    For next visit:  continue with routine prenatal care   Estil Mangle,  DO  [facility] "

## 2024-04-29 NOTE — Patient Instructions (Signed)
 Third Trimester of Pregnancy  The third trimester of pregnancy is from week 28 through week 40. This is months 7 through 9. The third trimester is a time when your baby is growing fast. Body changes during your third trimester Your body continues to change during this time. The changes usually go away after your baby is born. Physical changes You will continue to gain weight. You may get stretch marks on your hips, belly, and breasts. Your breasts will keep growing and may hurt. A yellow fluid (colostrum) may leak from your breasts. This is the first milk you're making for your baby. Your hair may grow faster and get thicker. In some cases, you may get hair loss. Your belly button may stick out. You may have more swelling in your hands, face, or ankles. Health changes You may have heartburn. You may feel short of breath. This is caused by the uterus that is now bigger. You may have more aches in the pelvis, back, or thighs. You may have more tingling or numbness in your hands, arms, and legs. You may pee more often. You may have trouble pooping (constipation) or swollen veins in the butt that can itch or get painful (hemorrhoids). Other changes You may have more problems sleeping. You may notice the baby moving lower in your belly (dropping). You may have more fluid coming from your vagina. Your joints may feel loose, and you may have pain around your pelvic bone. Follow these instructions at home: Medicines Take medicines only as told by your health care provider. Some medicines are not safe during pregnancy. Your provider may change the medicines that you take. Do not take any medicines unless told to by your provider. Take a prenatal vitamin that has at least 600 micrograms (mcg) of folic acid. Do not use herbal medicines, illegal drugs, or medicines that are not approved by your provider. Eating and drinking While you're pregnant your body needs additional nutrition to help  support your growing baby. Talk with your provider about your nutritional needs. Activity Most women are able to exercise regularly during pregnancy. Exercise routines may need to change at the end of your pregnancy. Talk to your provider about your activities and exercise routine. Relieving pain and discomfort Rest often with your legs raised if you have leg cramps or low back pain. Take warm sitz baths to soothe pain from hemorrhoids. Use hemorrhoid cream if your provider says it's okay. Wear a good, supportive bra if your breasts hurt. Do not use hot tubs, steam rooms, or saunas. Do not douche. Do not use tampons or scented pads. Safety Talk to your provider before traveling far distances. Wear your seatbelt at all times when you're in a car. Talk to your provider if someone hits you, hurts you, or yells at you. Preparing for birth To prepare for your baby: Take childbirth and breastfeeding classes. Visit the hospital and tour the maternity area. Buy a rear-facing car seat. Learn how to install it in your car. General instructions Avoid cat litter boxes and soil used by cats. These things carry germs that can cause harm to your pregnancy and your baby. Do not drink alcohol, smoke, vape, or use products with nicotine or tobacco in them. If you need help quitting, talk with your provider. Keep all follow-up visits for your third trimester. Your provider will do more exams and tests during this trimester. Write down your questions. Take them to your prenatal visits. Your provider also will: Talk with you about  your overall health. Give you advice or refer you to specialists who can help with different needs, including: Mental health and counseling. Foods and healthy eating. Ask for help if you need help with food. Where to find more information American Pregnancy Association: americanpregnancy.org [employer]: acog.org Office on [employer] Health:  [employer] Contact a health care provider if: You have a headache that does not go away when you take medicine. You have any of these problems: You can't eat or drink. You have nausea and vomiting. You have watery poop (diarrhea) for 2 days or more. You have pain when you pee, or your pee smells bad. You have been sick for 2 days or more and aren't getting better. Contact your provider right away if: You have any of these coming from your vagina: Abnormal discharge. Bad-smelling fluid. Bleeding. Your baby is moving less than usual. You have signs of labor: You have any contractions, belly cramping, or have pain in your pelvis or lower back before 37 weeks of pregnancy (preterm labor). You have regular contractions that are less than 5 minutes apart. Your water breaks. You have symptoms of high blood pressure or preeclampsia. These include: A severe, throbbing headache that does not go away. Sudden or extreme swelling of your face, hands, legs, or feet. Vision problems: You see spots. You have blurry vision. Your eyes are sensitive to light. If you can't reach your provider, go to an urgent care or emergency room. Get help right away if: You faint, become confused, or can't think clearly. You have chest pain or trouble breathing. You have any kind of injury, such as from a fall or a car crash. These symptoms may be an emergency. Call 911 right away. Do not wait to see if the symptoms will go away. Do not drive yourself to the hospital. This information is not intended to replace advice given to you by your health care provider. Make sure you discuss any questions you have with your health care provider. Document Revised: 12/20/2022 Document Reviewed: 07/20/2022 Elsevier Patient Education  2024 [employer].

## 2024-04-30 ENCOUNTER — Ambulatory Visit: Payer: Self-pay | Admitting: Obstetrics

## 2024-04-30 LAB — CERVICOVAGINAL ANCILLARY ONLY
Chlamydia: NEGATIVE
Comment: NEGATIVE
Comment: NEGATIVE
Comment: NORMAL
Neisseria Gonorrhea: NEGATIVE
Trichomonas: NEGATIVE

## 2024-05-04 LAB — STREP GP B CULTURE+RFLX: Strep Gp B Culture+Rflx: NEGATIVE

## 2024-05-05 ENCOUNTER — Encounter: Admitting: Obstetrics & Gynecology

## 2024-05-05 ENCOUNTER — Ambulatory Visit: Admitting: Certified Nurse Midwife

## 2024-05-05 ENCOUNTER — Encounter: Payer: Self-pay | Admitting: Certified Nurse Midwife

## 2024-05-05 VITALS — BP 108/71 | HR 121 | Wt 226.0 lb

## 2024-05-05 DIAGNOSIS — Z3A39 39 weeks gestation of pregnancy: Secondary | ICD-10-CM

## 2024-05-05 DIAGNOSIS — O0933 Supervision of pregnancy with insufficient antenatal care, third trimester: Secondary | ICD-10-CM

## 2024-05-05 NOTE — Assessment & Plan Note (Addendum)
 Feeling ready for baby.  SVE 3.5/50/-3, membrane swept.  Discussed IOL at 39 weeks in no labor. Will schedule next week if still pregnant.  Reviewed labor warning signs and expectations for birth. Instructed to call office or come to hospital with persistent headache, vision changes, regular contractions, leaking of fluid, decreased fetal movement or vaginal bleeding.

## 2024-05-05 NOTE — Progress Notes (Addendum)
" ° ° °  Return Prenatal Note   Subjective   24 y.o. H5E8978 at [redacted]w[redacted]d presents for this follow-up prenatal visit.  Patient is doing well. She reports good fetal movement. She has been having some pelvic pressure. Patient reports: Movement: Present Contractions: Irregular  Objective   Flow sheet Vitals: Pulse Rate: (!) 121 BP: 108/71 Fundal Height: 40 cm Fetal Heart Rate (bpm): 145 Dilation: 3.5 Effacement (%): 50 Station: -3 Total weight gain: 26 lb (11.8 kg)  General Appearance  No acute distress, well appearing, and well nourished Pulmonary   Normal work of breathing Neurologic   Alert and oriented to person, place, and time Psychiatric   Mood and affect within normal limits   Assessment/Plan   Plan  24 y.o. H5E8978 at [redacted]w[redacted]d presents for follow-up OB visit. Reviewed prenatal record including previous visit note.  Insufficient prenatal care in third trimester Feeling ready for baby.  SVE 3.5/50/-3, membrane swept.  Discussed IOL at 39 weeks in no labor. Will schedule next week if still pregnant.  Reviewed labor warning signs and expectations for birth. Instructed to call office or come to hospital with persistent headache, vision changes, regular contractions, leaking of fluid, decreased fetal movement or vaginal bleeding.    Future Appointments  Date Time Provider Department Center  05/11/2024  8:15 AM Jayne Harlene CROME, CNM [facility] None    For next visit:  continue with routine prenatal care     Damien PARSLEY, CNM  02/03/265:52 PM "

## 2024-05-11 ENCOUNTER — Inpatient Hospital Stay: Admit: 2024-05-11

## 2024-05-11 ENCOUNTER — Encounter: Admitting: Certified Nurse Midwife

## 2024-05-11 DIAGNOSIS — Z3A39 39 weeks gestation of pregnancy: Secondary | ICD-10-CM

## 2024-05-11 DIAGNOSIS — Z3480 Encounter for supervision of other normal pregnancy, unspecified trimester: Secondary | ICD-10-CM
